# Patient Record
Sex: Female | Born: 1950 | Race: White | Hispanic: No | Marital: Married | Smoking: Former smoker
Health system: Southern US, Community
[De-identification: ages and names within clinical notes are randomized; demographics above are authoritative.]

## PROBLEM LIST (undated history)

## (undated) DIAGNOSIS — K644 Residual hemorrhoidal skin tags: Secondary | ICD-10-CM

## (undated) DIAGNOSIS — Z9889 Other specified postprocedural states: Secondary | ICD-10-CM

## (undated) DIAGNOSIS — M810 Age-related osteoporosis without current pathological fracture: Secondary | ICD-10-CM

## (undated) DIAGNOSIS — H269 Unspecified cataract: Secondary | ICD-10-CM

## (undated) DIAGNOSIS — K219 Gastro-esophageal reflux disease without esophagitis: Secondary | ICD-10-CM

## (undated) DIAGNOSIS — K648 Other hemorrhoids: Secondary | ICD-10-CM

## (undated) DIAGNOSIS — Z86018 Personal history of other benign neoplasm: Secondary | ICD-10-CM

## (undated) DIAGNOSIS — R112 Nausea with vomiting, unspecified: Secondary | ICD-10-CM

## (undated) HISTORY — DX: Age-related osteoporosis without current pathological fracture: M81.0

## (undated) HISTORY — PX: TUBAL LIGATION: SHX77

## (undated) HISTORY — DX: Unspecified cataract: H26.9

---

## 1997-03-20 ENCOUNTER — Other Ambulatory Visit: Admission: RE | Admit: 1997-03-20 | Discharge: 1997-03-20 | Payer: Self-pay | Admitting: Obstetrics and Gynecology

## 2000-06-07 ENCOUNTER — Other Ambulatory Visit: Admission: RE | Admit: 2000-06-07 | Discharge: 2000-06-07 | Payer: Self-pay | Admitting: Internal Medicine

## 2001-04-20 ENCOUNTER — Other Ambulatory Visit: Admission: RE | Admit: 2001-04-20 | Discharge: 2001-04-20 | Payer: Self-pay | Admitting: Obstetrics and Gynecology

## 2002-04-25 ENCOUNTER — Other Ambulatory Visit: Admission: RE | Admit: 2002-04-25 | Discharge: 2002-04-25 | Payer: Self-pay | Admitting: Obstetrics and Gynecology

## 2003-04-03 ENCOUNTER — Other Ambulatory Visit: Admission: RE | Admit: 2003-04-03 | Discharge: 2003-04-03 | Payer: Self-pay | Admitting: Obstetrics and Gynecology

## 2004-04-09 ENCOUNTER — Other Ambulatory Visit: Admission: RE | Admit: 2004-04-09 | Discharge: 2004-04-09 | Payer: Self-pay | Admitting: Obstetrics and Gynecology

## 2004-05-12 ENCOUNTER — Encounter (INDEPENDENT_AMBULATORY_CARE_PROVIDER_SITE_OTHER): Payer: Self-pay | Admitting: Specialist

## 2004-05-12 ENCOUNTER — Observation Stay (HOSPITAL_COMMUNITY): Admission: RE | Admit: 2004-05-12 | Discharge: 2004-05-13 | Payer: Self-pay | Admitting: Obstetrics and Gynecology

## 2004-05-12 HISTORY — PX: VAGINAL HYSTERECTOMY: SUR661

## 2004-08-16 LAB — HM DEXA SCAN

## 2005-04-13 ENCOUNTER — Other Ambulatory Visit: Admission: RE | Admit: 2005-04-13 | Discharge: 2005-04-13 | Payer: Self-pay | Admitting: Obstetrics and Gynecology

## 2005-04-13 LAB — HM PAP SMEAR: HM Pap smear: NORMAL

## 2009-10-09 ENCOUNTER — Encounter: Admission: RE | Admit: 2009-10-09 | Discharge: 2009-10-09 | Payer: Self-pay | Admitting: Obstetrics and Gynecology

## 2010-07-04 NOTE — Op Note (Signed)
Judy Donovan, Judy Donovan            ACCOUNT NO.:  [id number]   MEDICAL RECORD NO.:  [id number]          PATIENT TYPE:  AMB   LOCATION:  SDC                           FACILITY:  [facility]   PHYSICIAN:  Cynthia P. Romine, M.D.DATE OF BIRTH:  1951/02/14   DATE OF PROCEDURE:  05/12/2004  DATE OF DISCHARGE:                                 OPERATIVE REPORT   PREOPERATIVE DIAGNOSIS:  Postmenopausal bleeding, known submucous fibroids,  and intramural fibroids.  The patient with strong desire for removal of both  ovaries.   POSTOPERATIVE DIAGNOSIS:  Postmenopausal bleeding, known submucous fibroids,  and intramural fibroids.  The patient with strong desire for removal of both  ovaries. Pathology pending.   PROCEDURE:  Total vaginal hysterectomy, bilateral salpingo-oophorectomy.   SURGEON:  Cynthia P. Romine, M.D.   ASSISTANT:  Andres Ege, M.D.   ANESTHESIA:  General endotracheal.   ESTIMATED BLOOD LOSS:  375 mL.   COMPLICATIONS:  None.   DESCRIPTION OF PROCEDURE:  The patient was taken to the operating room and  after the induction of adequate general endotracheal anesthesia, was placed  in the dorsal lithotomy position and prepped and draped in the usual  fashion.  The bladder was drained with a red rubber catheter.  A posterior  weighted and anterior Deaver retractor were placed.  Cervix was grasped with  a Christella Hartigan tenaculum.  The mucosa over the cervix was infiltrated with  approximately 6 mL of 1% Xylocaine with epinephrine.  The mucosa was incised  off the cervix and pushed back sharply.  Attention was next turned  posteriorly.  A posterior colpotomy incision was made and the Banana  retractor was placed into the posterior cul-de-sac.  The uterosacral  ligaments were clamped, cut, and tied bilaterally.  Attention was next  turned anteriorly.  The anterior peritoneum was identified, elevated, and  entered atraumatically.  Deaver retractor was placed and bowel was noted to  confirm proper placement in the anterior cul-de-sac.  The pedicle containing  the uterine arteries was then clamped, cut, and tied on both sides.  The  hysterectomy continued up the broad ligament, clamping, cutting, and tying  in sequence.  There was an approximate 2 cm intramural myoma on the  patient's left which made this slightly more difficult, however, upon going  about halfway up the broad ligament, the surgeon was able to deliver the  fundus through the posterior cul-de-sac and the pedicle containing the tube,  the round ligament, and the utero-ovarian ligament were clamped, cut, and  tied on both sides, and the specimen sent to pathology.  These pedicles were  doubly tied first with a free tie of 0 Vicryl and then with a transfixation  suture of 0 Vicryl.  Attention was next turned to the BSO.  The ovary on the  left was grasped with the Tanja Port as was the tube and it was brought into  view.  A Heaney clamp was placed behind the ovary and the tube across the  infundibulopelvic ligament and the specimen was removed.  The pedicle was  doubly tied with two free ties and then also suture  transfixed with 0  Vicryl.  No bleeding was encountered.  The procedure was repeated on the  patient's right identifying the tubes and ovaries, grasping it with  Babcock's, and bringing them into the surgical field.  A Heaney was placed  across the infundibulopelvic ligament.  The specimen was removed and sent to  pathology and the pedicle was controlled using two free ties of 0 Vicryl and  then a 0 Vicryl transfixation stitch.  Again the pedicle was inspected and  felt to be hemostatic.  The abdomen was irrigated.  Irrigant was clear.  Pedicles were again inspected and were felt to be free of bleeding.  Attention was next turned to the vaginal cuff.  It was closed from 2 o'clock  to 10 o'clock in a running locking fashion.  A McCall stitch was then placed  bringing the uterosacral ligaments together  in the midline and tied.  A Weck-  cel sponge was then placed back up in the abdominal cavity and pulled back  out and it was noted to have minimal blood on the tail sponge.  The abdomen  and vagina were irrigated.  The cuff was closed with figure-of-eight sutures  of 0 Vicryl.  The vagina was irrigated.  The bladder was drained with a red  rubber catheter and the procedure was terminated.  Needle, sponge, and  instrument counts correct x3.  The patient was taken to the recovery room in  satisfactory condition.      CPR/MEDQ  D:  05/12/2004  T:  05/12/2004  Job:  [id number]

## 2010-09-02 ENCOUNTER — Other Ambulatory Visit: Payer: Self-pay | Admitting: Obstetrics and Gynecology

## 2010-09-02 ENCOUNTER — Other Ambulatory Visit: Payer: Self-pay | Admitting: Occupational Therapy

## 2010-09-02 DIAGNOSIS — M8580 Other specified disorders of bone density and structure, unspecified site: Secondary | ICD-10-CM

## 2010-09-02 DIAGNOSIS — Z1231 Encounter for screening mammogram for malignant neoplasm of breast: Secondary | ICD-10-CM

## 2010-11-17 ENCOUNTER — Other Ambulatory Visit: Payer: Self-pay

## 2010-11-17 ENCOUNTER — Ambulatory Visit: Payer: Self-pay

## 2010-11-18 ENCOUNTER — Ambulatory Visit
Admission: RE | Admit: 2010-11-18 | Discharge: 2010-11-18 | Disposition: A | Discharge status: Home / Self Care | Payer: BC Managed Care – PPO | Source: Ambulatory Visit | Attending: Obstetrics and Gynecology | Admitting: Obstetrics and Gynecology

## 2010-11-18 DIAGNOSIS — M8580 Other specified disorders of bone density and structure, unspecified site: Secondary | ICD-10-CM

## 2010-11-18 DIAGNOSIS — Z1231 Encounter for screening mammogram for malignant neoplasm of breast: Secondary | ICD-10-CM

## 2011-10-13 ENCOUNTER — Other Ambulatory Visit: Payer: Self-pay | Admitting: Obstetrics and Gynecology

## 2011-10-13 DIAGNOSIS — Z1231 Encounter for screening mammogram for malignant neoplasm of breast: Secondary | ICD-10-CM

## 2011-11-19 ENCOUNTER — Ambulatory Visit
Admission: RE | Admit: 2011-11-19 | Discharge: 2011-11-19 | Disposition: A | Discharge status: Home / Self Care | Payer: BC Managed Care – PPO | Source: Ambulatory Visit | Attending: Obstetrics and Gynecology | Admitting: Obstetrics and Gynecology

## 2011-11-19 DIAGNOSIS — Z1231 Encounter for screening mammogram for malignant neoplasm of breast: Secondary | ICD-10-CM

## 2012-07-14 ENCOUNTER — Encounter: Payer: Self-pay | Admitting: *Deleted

## 2012-07-15 ENCOUNTER — Ambulatory Visit (INDEPENDENT_AMBULATORY_CARE_PROVIDER_SITE_OTHER): Payer: BC Managed Care – PPO | Admitting: Nurse Practitioner

## 2012-07-15 ENCOUNTER — Encounter: Payer: Self-pay | Admitting: Nurse Practitioner

## 2012-07-15 VITALS — BP 124/60 | HR 66 | Resp 14 | Ht 65.0 in | Wt 169.4 lb

## 2012-07-15 DIAGNOSIS — Z01419 Encounter for gynecological examination (general) (routine) without abnormal findings: Secondary | ICD-10-CM

## 2012-07-15 DIAGNOSIS — Z0000 Encounter for general adult medical examination without abnormal findings: Secondary | ICD-10-CM

## 2012-07-15 LAB — POCT URINALYSIS DIPSTICK
Leukocytes, UA: NEGATIVE
Spec Grav, UA: 1.015
Urobilinogen, UA: NEGATIVE
pH, UA: 5.5

## 2012-07-15 NOTE — Progress Notes (Signed)
62 y.o. G2P2 Married Caucasian Fe here for annual exam.  No health concerns.  She does want to come off Vivelle dot.  She has been tapering dose since January. Now using patch every 2 wk's for this past month and doing OK. Current patch was applied two weeks ago. Using OTC lubrication for vaginal dryness.  No LMP recorded. Patient is postmenopausal.          Sexually active: yes  The current method of family planning is status post hysterectomy.    Exercising: no  The patient does not participate in regular exercise at present. Smoker:  no  Health Maintenance: Pap:  04/13/2005  Normal  MMG:  11/2011 normal Colonoscopy:  2007 polyp repeat in 5 years - insurance not covering so does IFOB BMD:   2012 T Score: spine -1.5, hip - 2.0, wants to repeat next year TDaP:  Up to date, per pt. 2005 Labs: Hgb- 14.1   reports that she has never smoked. She has never used smokeless tobacco. She reports that she drinks about 0.5 ounces of alcohol per week. She reports that she does not use illicit drugs.  Past Medical History  Diagnosis Date  . Menopausal and perimenopausal disorder   . Osteoporosis     Past Surgical History  Procedure Laterality Date  . Abdominal hysterectomy  04/2004    TVH/BSO  . Tubal ligation      BTL  . Vaginal delivery      SVD x3    Current Outpatient Prescriptions  Medication Sig Dispense Refill  . Ascorbic Acid (VITAMIN C) 100 MG tablet Take 100 mg by mouth daily.      Marland Kitchen aspirin 81 MG tablet Take 81 mg by mouth daily.      . Calcium-Vitamin D-Vitamin K (VIACTIV PO) Take by mouth daily.      . cetirizine (ZYRTEC) 5 MG tablet Take 5 mg by mouth daily.      Marland Kitchen estradiol (VIVELLE-DOT) 0.05 MG/24HR Place 1 patch onto the skin once a week.      . fish oil-omega-3 fatty acids 1000 MG capsule Take 2 g by mouth daily.      . Multiple Vitamin (MULTIVITAMIN) tablet Take 1 tablet by mouth daily.      . psyllium (REGULOID) 0.52 G capsule Take 0.52 g by mouth daily.      .  vitamin E 100 UNIT capsule Take 100 Units by mouth daily.       No current facility-administered medications for this visit.    Family History  Problem Relation Age of Onset  . Osteoarthritis Mother   . Hypertension Mother     ROS:  Pertinent items are noted in HPI.  Otherwise, a comprehensive ROS was negative.  Exam:   BP 124/60  Pulse 66  Resp 14  Ht 5\' 5"  (1.651 m)  Wt 169 lb 6.4 oz (76.839 kg)  BMI 28.19 kg/m2 Height: 5\' 5"  (165.1 cm)  Ht Readings from Last 3 Encounters:  07/15/12 5\' 5"  (1.651 m)    General appearance: alert, cooperative and appears stated age Head: Normocephalic, without obvious abnormality, atraumatic Neck: no adenopathy, supple, symmetrical, trachea midline and thyroid normal to inspection and palpation Lungs: clear to auscultation bilaterally Breasts: normal appearance, no masses or tenderness Heart: regular rate and rhythm Abdomen: soft, non-tender; no masses,  no organomegaly Extremities: extremities normal, atraumatic, no cyanosis or edema Skin: Skin color, texture, turgor normal. No rashes or lesions Lymph nodes: Cervical, supraclavicular, and axillary nodes normal.  No abnormal inguinal nodes palpated Neurologic: Grossly normal   Pelvic: External genitalia:  no lesions              Urethra:  normal appearing urethra with no masses, tenderness or lesions              Bartholin's and Skene's: normal                 Vagina: normal appearing vagina with normal color and discharge, no lesions              Cervix: absent              Pap taken: no Bimanual Exam:  Uterus:  uterus absent              Adnexa: no mass, fullness, tenderness               Rectovaginal: Confirms               Anus:  normal sphincter tone, no lesions  A:  Well Woman with normal exam  S/P TVH/BSO 04/2004 on ERT since then - now tapering off  P:   Pap smear as per guidelines   Mammogram due 10/14  She decides to see PCP to get regular labs this year and update on  Shingles vaccine  She has decided to discontinue ERT - if symptoms gets unbearable to call back  counseled on adequate intake of calcium and vitamin D, exercise, osteoporosis, SBE  return annually or prn  An After Visit Summary was printed and given to the patient.

## 2012-07-15 NOTE — Patient Instructions (Signed)

## 2012-07-15 NOTE — Progress Notes (Signed)
Reviewed personally. 

## 2012-07-18 LAB — HEMOGLOBIN, FINGERSTICK: Hemoglobin, fingerstick: 14.1 g/dL (ref 12.0–16.0)

## 2012-07-19 ENCOUNTER — Ambulatory Visit: Payer: BC Managed Care – PPO | Admitting: Nurse Practitioner

## 2012-11-01 ENCOUNTER — Other Ambulatory Visit: Payer: Self-pay

## 2012-11-01 DIAGNOSIS — Z1231 Encounter for screening mammogram for malignant neoplasm of breast: Secondary | ICD-10-CM

## 2012-11-21 ENCOUNTER — Ambulatory Visit
Admission: RE | Admit: 2012-11-21 | Discharge: 2012-11-21 | Disposition: A | Discharge status: Home / Self Care | Payer: BC Managed Care – PPO | Source: Ambulatory Visit

## 2012-11-21 DIAGNOSIS — Z1231 Encounter for screening mammogram for malignant neoplasm of breast: Secondary | ICD-10-CM

## 2013-02-16 HISTORY — PX: CATARACT EXTRACTION W/ INTRAOCULAR LENS  IMPLANT, BILATERAL: SHX1307

## 2013-07-20 ENCOUNTER — Ambulatory Visit (INDEPENDENT_AMBULATORY_CARE_PROVIDER_SITE_OTHER): Payer: BC Managed Care – PPO | Admitting: Nurse Practitioner

## 2013-07-20 ENCOUNTER — Encounter: Payer: Self-pay | Admitting: Nurse Practitioner

## 2013-07-20 VITALS — BP 108/66 | HR 76 | Ht 65.25 in | Wt 170.0 lb

## 2013-07-20 DIAGNOSIS — Z23 Encounter for immunization: Secondary | ICD-10-CM

## 2013-07-20 DIAGNOSIS — M899 Disorder of bone, unspecified: Secondary | ICD-10-CM

## 2013-07-20 DIAGNOSIS — Z01419 Encounter for gynecological examination (general) (routine) without abnormal findings: Secondary | ICD-10-CM

## 2013-07-20 DIAGNOSIS — M949 Disorder of cartilage, unspecified: Secondary | ICD-10-CM

## 2013-07-20 DIAGNOSIS — M8580 Other specified disorders of bone density and structure, unspecified site: Secondary | ICD-10-CM

## 2013-07-20 DIAGNOSIS — Z1211 Encounter for screening for malignant neoplasm of colon: Secondary | ICD-10-CM

## 2013-07-20 DIAGNOSIS — Z0000 Encounter for general adult medical examination without abnormal findings: Secondary | ICD-10-CM

## 2013-07-20 LAB — POCT URINALYSIS DIPSTICK
Bilirubin, UA: NEGATIVE
Blood, UA: NEGATIVE
Glucose, UA: NEGATIVE
Ketones, UA: NEGATIVE
Leukocytes, UA: NEGATIVE
Nitrite, UA: NEGATIVE
Protein, UA: NEGATIVE
Urobilinogen, UA: NEGATIVE
pH, UA: 5

## 2013-07-20 LAB — COMPREHENSIVE METABOLIC PANEL
ALT: 27 U/L (ref 0–35)
AST: 28 U/L (ref 0–37)
Albumin: 4 g/dL (ref 3.5–5.2)
Alkaline Phosphatase: 73 U/L (ref 39–117)
BUN: 13 mg/dL (ref 6–23)
CO2: 28 mEq/L (ref 19–32)
Calcium: 9.8 mg/dL (ref 8.4–10.5)
Chloride: 104 mEq/L (ref 96–112)
Creat: 0.73 mg/dL (ref 0.50–1.10)
Glucose, Bld: 79 mg/dL (ref 70–99)
Potassium: 4.2 mEq/L (ref 3.5–5.3)
Sodium: 143 mEq/L (ref 135–145)
Total Bilirubin: 0.4 mg/dL (ref 0.2–1.2)
Total Protein: 7.1 g/dL (ref 6.0–8.3)

## 2013-07-20 LAB — LIPID PANEL
Cholesterol: 256 mg/dL — ABNORMAL HIGH (ref 0–200)
HDL: 53 mg/dL (ref >39)
LDL Cholesterol: 168 mg/dL — ABNORMAL HIGH (ref 0–99)
Total CHOL/HDL Ratio: 4.8 Ratio
Triglycerides: 177 mg/dL — ABNORMAL HIGH (ref <150)
VLDL: 35 mg/dL (ref 0–40)

## 2013-07-20 LAB — HEMOGLOBIN, FINGERSTICK: Hemoglobin, fingerstick: 13.8 g/dL (ref 12.0–16.0)

## 2013-07-20 NOTE — Progress Notes (Signed)
Patient ID: Judy Donovan, female   DOB: 07/31/1950, 63 y.o.   MRN: [id number] 63 y.o. G2P2 Married Caucasian Fe here for annual exam.  Now off Vivelle dot since June 2014. She is doing well.  Sometimes feels like bladder is falling.  Occasionally has a fullness with SA that she thinks is related to bladder falling.  Patient's last menstrual period was 04/16/2004.          Sexually active: yes  The current method of family planning is post menopausal status and hysterectomy.    Exercising: yes  walking Smoker:  no  Health Maintenance: Pap: 04/13/2005 Normal  MMG: 11/21/12, Bi-Rads 1: negative   Colonoscopy: 2007 polyp repeat in 5 years - insurance not covering so does IFOB  BMD: 2012 T Score: spine -1.5, hip - 2.0, wants to repeat next year  TDaP: 2005, would like to have today Labs:  HB: 13.8   Urine:  Negative     reports that she has never smoked. She has never used smokeless tobacco. She reports that she drinks about .5 ounces of alcohol per week. She reports that she does not use illicit drugs.  Past Medical History  Diagnosis Date  . Menopausal and perimenopausal disorder   . Osteoporosis     Past Surgical History  Procedure Laterality Date  . Abdominal hysterectomy  04/2004    TVH/BSO  . Tubal ligation      BTL  . Vaginal delivery      SVD x3  . Cataract extraction extracapsular Bilateral 4/15, 5/15    Current Outpatient Prescriptions  Medication Sig Dispense Refill  . aspirin 81 MG tablet Take 81 mg by mouth daily.      . cetirizine (ZYRTEC) 5 MG tablet Take 5 mg by mouth daily.      . fish oil-omega-3 fatty acids 1000 MG capsule Take 2 g by mouth daily.      . Multiple Vitamin (MULTIVITAMIN) tablet Take 1 tablet by mouth daily.      . psyllium (REGULOID) 0.52 G capsule Take 0.52 g by mouth daily.      . vitamin E 100 UNIT capsule Take 100 Units by mouth daily.      . Ascorbic Acid (VITAMIN C) 100 MG tablet Take 100 mg by mouth daily.      . Calcium-Vitamin  D-Vitamin K (VIACTIV PO) Take 2 tablets by mouth daily.        No current facility-administered medications for this visit.    Family History  Problem Relation Age of Onset  . Osteoarthritis Mother   . Hypertension Mother     ROS:  Pertinent items are noted in HPI.  Otherwise, a comprehensive ROS was negative.  Exam:   BP 108/66  Pulse 76  Ht 5' 5.25" (1.657 m)  Wt 170 lb (77.111 kg)  BMI 28.08 kg/m2  LMP 04/16/2004 Height: 5' 5.25" (165.7 cm)  Ht Readings from Last 3 Encounters:  07/20/13 5' 5.25" (1.657 m)  07/15/12 5\' 5"  (1.651 m)    General appearance: alert, cooperative and appears stated age Head: Normocephalic, without obvious abnormality, atraumatic Neck: no adenopathy, supple, symmetrical, trachea midline and thyroid normal to inspection and palpation Lungs: clear to auscultation bilaterally Breasts: normal appearance, no masses or tenderness Heart: regular rate and rhythm Abdomen: soft, non-tender; no masses,  no organomegaly Extremities: extremities normal, atraumatic, no cyanosis or edema Skin: Skin color, texture, turgor normal. No rashes or lesions Lymph nodes: Cervical, supraclavicular, and axillary nodes normal. No abnormal  inguinal nodes palpated Neurologic: Grossly normal   Pelvic: External genitalia:  no lesions              Urethra:  normal appearing urethra with no masses, tenderness or lesions              Bartholin's and Skene's: normal                 Vagina: some atrophic changes appearing vagina with pale color and discharge, no lesions.   There is a grade I cystocele              Cervix: absent              Pap taken: no Bimanual Exam:  Uterus:  uterus absent              Adnexa: no mass, fullness, tenderness               Rectovaginal: Confirms               Anus:  normal sphincter tone, no lesions  A:  Well Woman with normal exam  S/P TVH, BSO secondary to fibroids 04/2004  ERT from 04/2004 - 07/2012  Mild grade I cystocele  Osteopenia    Immunization update  P:   Reviewed health and wellness pertinent to exam  Pap smear not taken today  Follow with routine labs  TDaP given today  IFOB is given  Mammogram is due 10/15 and will get repeat BMD at that time  Counseled on breast self exam, mammography screening, adequate intake of calcium and vitamin D, diet and exercise, Kegel's exercises return annually or prn  An After Visit Summary was printed and given to the patient.

## 2013-07-20 NOTE — Patient Instructions (Signed)

## 2013-07-21 LAB — VITAMIN D 25 HYDROXY (VIT D DEFICIENCY, FRACTURES): Vit D, 25-Hydroxy: 79 ng/mL (ref 30–89)

## 2013-07-21 LAB — TSH: TSH: 1.936 u[IU]/mL (ref 0.350–4.500)

## 2013-07-21 NOTE — Progress Notes (Signed)
Encounter reviewed by Dr. Sricharan Lacomb Silva.  

## 2013-08-02 NOTE — Addendum Note (Signed)
Addended by: Graylon Good on: 08/02/2013 08:52 AM   Modules accepted: Orders

## 2013-08-03 ENCOUNTER — Other Ambulatory Visit: Payer: Self-pay

## 2013-08-03 DIAGNOSIS — Z1231 Encounter for screening mammogram for malignant neoplasm of breast: Secondary | ICD-10-CM

## 2013-08-08 NOTE — Addendum Note (Signed)
Addended by: Antonietta Barcelona on: 08/08/2013 08:27 AM   Modules accepted: Orders

## 2013-08-23 LAB — FECAL OCCULT BLOOD, IMMUNOCHEMICAL: IFOBT: NEGATIVE

## 2013-08-24 ENCOUNTER — Telehealth: Payer: Self-pay | Admitting: *Deleted

## 2013-08-24 NOTE — Telephone Encounter (Signed)
I have attempted to contact this patient by phone with the following results: left message to return my call on answering machine (home per [facility]).  

## 2013-08-24 NOTE — Telephone Encounter (Signed)
Message copied by Graylon Good on Thu Aug 24, 2013  9:16 AM ------      Message from: Regina Eck      Created: Wed Aug 23, 2013  2:58 PM       Notify fecal occult blood stool screen was negattive ------

## 2013-08-25 NOTE — Telephone Encounter (Signed)
Pt notified in result note 08/24/13.

## 2013-11-22 ENCOUNTER — Ambulatory Visit
Admission: RE | Admit: 2013-11-22 | Discharge: 2013-11-22 | Disposition: A | Discharge status: Home / Self Care | Payer: BC Managed Care – PPO | Source: Ambulatory Visit

## 2013-11-22 ENCOUNTER — Ambulatory Visit
Admission: RE | Admit: 2013-11-22 | Discharge: 2013-11-22 | Disposition: A | Discharge status: Home / Self Care | Payer: BC Managed Care – PPO | Source: Ambulatory Visit | Attending: Nurse Practitioner | Admitting: Nurse Practitioner

## 2013-11-22 DIAGNOSIS — Z1231 Encounter for screening mammogram for malignant neoplasm of breast: Secondary | ICD-10-CM

## 2013-11-22 DIAGNOSIS — M8580 Other specified disorders of bone density and structure, unspecified site: Secondary | ICD-10-CM

## 2013-11-28 ENCOUNTER — Ambulatory Visit (INDEPENDENT_AMBULATORY_CARE_PROVIDER_SITE_OTHER): Payer: BC Managed Care – PPO | Admitting: Podiatry

## 2013-11-28 ENCOUNTER — Encounter: Payer: Self-pay | Admitting: Podiatry

## 2013-11-28 ENCOUNTER — Ambulatory Visit (INDEPENDENT_AMBULATORY_CARE_PROVIDER_SITE_OTHER): Payer: BC Managed Care – PPO

## 2013-11-28 VITALS — Ht 65.5 in | Wt 165.0 lb

## 2013-11-28 DIAGNOSIS — M779 Enthesopathy, unspecified: Secondary | ICD-10-CM

## 2013-11-28 DIAGNOSIS — S92301A Fracture of unspecified metatarsal bone(s), right foot, initial encounter for closed fracture: Secondary | ICD-10-CM

## 2013-11-28 NOTE — Progress Notes (Signed)
   Subjective:    Patient ID: Arlys Scatena, female    DOB: May 08, 1950, 63 y.o.   MRN: [id number]  HPI Comments: This foot has been swelling for 6 weeks and the foot has been hurting. Right foot lateral side of foot. It started on the top of the foot and moved to along the side and ankle and it has been swelling since   Foot Pain      Review of Systems  All other systems reviewed and are negative.      Objective:   Physical Exam: I have reviewed her past medical history medications allergies surgeries social history family history. Pulses are strongly palpable bilateral. Neurologic sensorium is intact per Semmes-Weinstein monofilament. Deep tendon reflexes are intact bilateral muscle strength is 5 over 5 dorsiflexors plantar flexors inverters everters all intrinsic musculature is intact. Orthopedic evaluation demonstrates all joints distal to the ankle a full range of motion without crepitation. She has pain on palpation with mild edema overlying the fourth fifth met base of the right foot. Radiographic evaluation does demonstrate a stress fracture fifth metatarsal base area of the right foot. This fracture is nondisplaced non-comminuted. Assessment: Nondisplaced non-comminuted stress fracture fifth metatarsal base right foot.  Plan: Compression anklet was dispensed as well as a Cam Walker. I will followup with her in 4 weeks for another set of x-rays.        Assessment & Plan:

## 2013-11-30 ENCOUNTER — Telehealth: Payer: Self-pay | Admitting: *Deleted

## 2013-11-30 NOTE — Telephone Encounter (Signed)
I have attempted to contact this patient by phone with the following results: left message to return call to [place] at [phone] answering machine (home per [facility]).  No personal information given.  [phone] (Home)

## 2013-12-04 NOTE — Telephone Encounter (Signed)
Pt is calling stephanie back °

## 2013-12-04 NOTE — Telephone Encounter (Signed)
Pt notified in result note.  Closing encounter. 

## 2013-12-05 ENCOUNTER — Telehealth: Payer: Self-pay | Admitting: Nurse Practitioner

## 2013-12-05 NOTE — Telephone Encounter (Signed)
Pt is calling stating that patty told her yesterday that she needed to schedule a consult with silva or miller to discuss her bone density. i dont see any notes about this.

## 2013-12-05 NOTE — Telephone Encounter (Signed)
Spoke with patient. Patient states "When Colletta [place] gave me my bone density results yesterday she told me Patty recommends that I have a consult with Dr.Silva or Dr.Miller. I just wanted to go ahead and get that scheduled so I can take care of myself." Consult appointment scheduled with Dr.Miller for 10/24 at 3pm. Patient is agreeable to date and time.  Routing to Dr.Miller Cc: Milford Cage, FNP   Routing to provider for final review. Patient agreeable to disposition. Will close encounter

## 2013-12-18 ENCOUNTER — Encounter: Payer: Self-pay | Admitting: Podiatry

## 2013-12-26 ENCOUNTER — Encounter: Payer: Self-pay | Admitting: Podiatry

## 2013-12-26 ENCOUNTER — Ambulatory Visit (INDEPENDENT_AMBULATORY_CARE_PROVIDER_SITE_OTHER): Payer: BC Managed Care – PPO | Admitting: Podiatry

## 2013-12-26 ENCOUNTER — Ambulatory Visit (INDEPENDENT_AMBULATORY_CARE_PROVIDER_SITE_OTHER): Payer: BC Managed Care – PPO

## 2013-12-26 VITALS — BP 124/81 | HR 87 | Resp 16

## 2013-12-26 DIAGNOSIS — S92301D Fracture of unspecified metatarsal bone(s), right foot, subsequent encounter for fracture with routine healing: Secondary | ICD-10-CM

## 2013-12-26 NOTE — Progress Notes (Signed)
She presents today for follow-up of her fractured fifth metatarsal base of the right foot. She presents in her Cam Walker. She states that it feels almost 100% improved.  Objective: Vital signs are stable she is alert and oriented 3. Pulses are palpable right foot. No pain on palpation fifth metatarsal base or peroneal tendons of the right foot.radiographic [place] well-healed fifth metatarsal fracture styloid process right.  Assessment:well healing fifth metatarsal base right foot.  Plan: Discontinue use of the Cam Walker follow-up with me as needed.

## 2014-01-09 ENCOUNTER — Other Ambulatory Visit: Payer: Self-pay | Admitting: Nurse Practitioner

## 2014-01-09 ENCOUNTER — Institutional Professional Consult (permissible substitution): Payer: BC Managed Care – PPO | Admitting: Obstetrics & Gynecology

## 2014-01-09 ENCOUNTER — Telehealth: Payer: Self-pay | Admitting: Nurse Practitioner

## 2014-01-09 MED ORDER — PRAMOXINE HCL 1 % RE FOAM: 1.0000 "application " | Freq: Three times a day (TID) | RECTAL | Status: DC | PRN

## 2014-01-09 NOTE — Telephone Encounter (Signed)
Spoke with patient. Advised patient of message as seen below from Patricia Rolen-Grubb, FNP. Patient is agreeable and verbalizes understanding.  Routing to provider for final review. Patient agreeable to disposition. Will close encounter ; 

## 2014-01-09 NOTE — Telephone Encounter (Signed)
Spoke with patient. Patient states that for 2-3 days "My hemorrhoid that is on the outside has been bothering me. It hurts to sit and walk. It is like the more I stand the worse it is. I have used Preparation H, my suppositories, a warm cloth, and I have taken a sitz bath with barely any relief. I have to be on me feet all day tomorrow and Thursday cooking and I was hoping she may have some recommendations that may help me." Advised patient will send a message to Milford Cage, [place] and return call with further recommendations and instructions for patient. Patient is agreeable.

## 2014-01-09 NOTE — Telephone Encounter (Signed)
Patient is having "a terrible time with hemorrhoids" and is wondering if Patty could recommend something besides Preparation   H.

## 2014-01-09 NOTE — Telephone Encounter (Signed)
Give her Proctofoam HC to use BID prn.  I will place the order.

## 2014-02-12 ENCOUNTER — Ambulatory Visit (INDEPENDENT_AMBULATORY_CARE_PROVIDER_SITE_OTHER): Payer: BC Managed Care – PPO | Admitting: Obstetrics & Gynecology

## 2014-02-12 ENCOUNTER — Encounter: Payer: Self-pay | Admitting: Obstetrics & Gynecology

## 2014-02-12 VITALS — BP 122/78 | HR 64 | Resp 16 | Wt 169.8 lb

## 2014-02-12 DIAGNOSIS — M810 Age-related osteoporosis without current pathological fracture: Secondary | ICD-10-CM

## 2014-02-12 LAB — COMPREHENSIVE METABOLIC PANEL
ALT: 16 U/L (ref 0–35)
AST: 19 U/L (ref 0–37)
Albumin: 4 g/dL (ref 3.5–5.2)
Alkaline Phosphatase: 90 U/L (ref 39–117)
BUN: 15 mg/dL (ref 6–23)
CO2: 27 mEq/L (ref 19–32)
Calcium: 9.1 mg/dL (ref 8.4–10.5)
Chloride: 106 mEq/L (ref 96–112)
Creat: 0.72 mg/dL (ref 0.50–1.10)
Glucose, Bld: 100 mg/dL — ABNORMAL HIGH (ref 70–99)
Potassium: 4.1 mEq/L (ref 3.5–5.3)
Sodium: 142 mEq/L (ref 135–145)
Total Bilirubin: 0.2 mg/dL (ref 0.2–1.2)
Total Protein: 6.8 g/dL (ref 6.0–8.3)

## 2014-02-12 NOTE — Progress Notes (Signed)
Second note opened in error.

## 2014-02-13 LAB — TSH: TSH: 1.653 u[IU]/mL (ref 0.350–4.500)

## 2014-02-13 LAB — PTH, INTACT AND CALCIUM
Calcium: 9.3 mg/dL (ref 8.4–10.5)
PTH: 39 pg/mL (ref 14–64)

## 2014-02-13 LAB — VITAMIN D 25 HYDROXY (VIT D DEFICIENCY, FRACTURES): Vit D, 25-Hydroxy: 34 ng/mL (ref 30–100)

## 2014-02-14 ENCOUNTER — Telehealth: Payer: Self-pay | Admitting: Emergency Medicine

## 2014-02-14 DIAGNOSIS — M810 Age-related osteoporosis without current pathological fracture: Secondary | ICD-10-CM

## 2014-02-14 NOTE — Progress Notes (Signed)
Patient ID: Judy Donovan, female   DOB: [birth year], 63 y.o.   MRN: [id number]  Subjective:    19 yrs Married Caucasian G2P2  female here to discuss recent BMD obtained 11/22/13 showing osteoporosis in left femoral neck with t score -2.7.  Pt has mother with severe osteoporosis in spine and with dowager's hump.  Pt does NOT want to ever get that way.  Not on HRT.  Actively working on exercise and thinks she gets enough calcium but will need to double check.     Osteoporosis Risk Factors  Nonmodifiable Personal Hx of fracture as an adult: no Hx of fracture in first-degree relative: yes - mother, vertebral fractures Caucasian race: yes Advanced age: no Female sex: yes Dementia: no Poor health/frailty: no  Potentially modifiable: Tobacco use: no Low body weight (<127 lbs): no Estrogen deficiency  early menopause (age <45) or bilateral ovariectomy: no  prolonged premenopausal amenorrhea (>1 yr): no Low calcium intake (lifelong): no Alcohol use more than 2 drinks per day: yes, pt is using this for coping with mother's deterioration.  D/w pt need to cut back. Recurrent falls: no Inadequate physical activity: no  Current calcium and Vit D intake:  She thinks around 800mg  calcium and 1000 IU Vit D  Review of Systems A comprehensive review of systems was negative.     Objective:   PHYSICAL EXAM BP 122/78 mmHg  Pulse 64  Resp 16  Wt 169 lb 12.8 oz (77.021 kg)  LMP 04/16/2004 General appearance: alert and cooperative  Imaging Bone Density: Spine T Score: -2.4 (was -1.5 on 10/12) , Hip T Score: -2.7 (was -2.0 on 10/12)    Done on 11/22/13 FRAX score:  Not done due to osteoporosis                                       Assessment:   Osteoporosis with T score -2.7   Plan:   1.  Patient counseled in adequate calcium and vitamin D exposure.  Calcium - 500 - 1000 mg elemental calcium/day in divided doses  Vitamin D - 800 IU/day  Told not to take at same time as  bisphosphonate. 2.  Exercise recommended at least 30 minutes 3 times per week.  3.  Recommendation to avoid heavy ETOH use.  4.  Counseled to avoid tobacco and second had smoke. 5.  Fall prevention discussed. 6.  Pharmacologic therapy therapy below discussed including risks and benefits:   Bisphosphonates po (Fosamax, Actonel, Boniva)  Bisphosphonate IV (Reclast)  Evista  Prolia subcutaneous   Forteo subcutaneous  Calcitonin nasal spray  Estrogen/progesterone therapy Will try to precert Prolia as this is pt's preference for treatment 7.  Repeat bone density in 2 years. 8.  PTH, CMP, Vit D today   ~25 minutes spent with patient >50% of time was in face to face discussion of above.

## 2014-02-14 NOTE — Telephone Encounter (Signed)
Call to [facility] to request order from speciality pharmacy for Prolia 60 mg Injection.   Per representative, this medication will require prior authorization.   J code [id number] Dx M81.0  Prior authorization faxed with fax confirmation received to [facility] at this time.   Prior authorization reference number is #[id number]

## 2014-02-21 ENCOUNTER — Telehealth: Payer: Self-pay | Admitting: Obstetrics & Gynecology

## 2014-02-21 NOTE — Telephone Encounter (Signed)
Incoming call from patient. Advised patient that prior authorization for Prolia was sent on 02/14/14. Apologized,  But unable to complete prior authorization and receiving drug from speciality pharmacy in short time frame.   Since patient was unable to receive injection by 02/15/14 she would like to wait on process until Medicare is active 03/2014.   Patient is advised to call back with new insurance information in 03/2014 to initiate process to order Prolia. She will do so when she has new coverage information and will begin process.  Routing to provider for final review. Patient agreeable to disposition. Will close encounter

## 2014-04-10 ENCOUNTER — Telehealth: Payer: Self-pay | Admitting: Nurse Practitioner

## 2014-04-10 DIAGNOSIS — Z01812 Encounter for preprocedural laboratory examination: Secondary | ICD-10-CM

## 2014-04-10 NOTE — Telephone Encounter (Signed)
Patient wants to talk with the nurse about prolia.

## 2014-04-10 NOTE — Telephone Encounter (Signed)
Call to patient. Obtained new insurance information. Patient now covered by Medicare, [employer] [facility] and [facility] plan.   Request sent to [employer]ia at this time for insurance verification. Advised patient would contact her with information regarding insurance coverage.

## 2014-04-13 NOTE — Telephone Encounter (Signed)
Received forms from [employer]. Patient requires prior authorization for Prolia.  Called [employer] and was directed to [place] Review Department at [phone].   Spoke with Clinical review department and prior authorization is not required. Reference number is [id number]. They will fax confirmation of this as well.   [place] at [phone], spoke with Shanon Brow, who discussed with  Medicare pharmacy benefits, Rob. Rop states that patient has coverage for Prolia under her medicare part D coverage, reference number [id number].   Called [facility] to request re-verification of benefits for Medicare Part D Plan, [employer].

## 2014-04-18 NOTE — Telephone Encounter (Signed)
Received form from [facility] that states that prior authorization is not required.   Receiving incoming fax from [place], only has physician buy and bill cost. Called [employer] again and requested re-verfication of benefits for Medicare Part D Drug coverage Plan under [employer]. They will work on coverage check.

## 2014-04-20 NOTE — Telephone Encounter (Signed)
Called Prolia again, spoke with Judy Donovan. Advised I have requested x 3 for medicare part D plan coverage to be checked. He sees this as still pending. He states that he will notify management and will ensure that coverage for part D be checked at this time.

## 2014-04-23 NOTE — Telephone Encounter (Signed)
Patient calling. She states she received her benefit information from [facility] and is ready to proceed.   She is scheduled for lab draw, Calcium only for 04/24/14 at 0930.  She states she contacted [facility] on [address] and they have Prolia available and she would like to pick it up from there. Patient aware of cost to her, $616.00. Advised I could not give benefits information regarding cost for injection, that I am waiting to hear back from [employer] regarding coverage.  Patient would like to proceed at this time.   Scheduled lab appointment. Advised with normal CA result, would place order to [facility] and patient will have to pick up and have nurse appointment same day if she is bring medication.

## 2014-04-24 ENCOUNTER — Other Ambulatory Visit: Payer: Self-pay

## 2014-04-24 ENCOUNTER — Telehealth: Payer: Self-pay | Admitting: Nurse Practitioner

## 2014-04-24 NOTE — Telephone Encounter (Signed)
Pt was scheduled for calcium check for prolia.  Please advise follow up.

## 2014-04-24 NOTE — Telephone Encounter (Signed)
Pt called this morning to cancel her lab appointment. Pt states her mother had a stroke and they are at hospital now.  Pt will reschedule at later date.

## 2014-04-24 NOTE — Telephone Encounter (Signed)
Patient called and cancelled lab appointment for this morning due to Mother hospitalization.

## 2014-04-26 NOTE — Telephone Encounter (Signed)
Please look at this note about calcium - doesn't look like she is getting Prolia now.  So seems OK if she has to postpone due to mothers illness.  Agree?

## 2014-04-27 NOTE — Telephone Encounter (Signed)
OK to postpone for now and cancel lab testing.  Pt and I had consultation and she is aware of bone issues.  With family issues, I'm sure it's just not high on her priority list.

## 2014-04-30 ENCOUNTER — Other Ambulatory Visit (INDEPENDENT_AMBULATORY_CARE_PROVIDER_SITE_OTHER): Payer: Medicare Other

## 2014-04-30 DIAGNOSIS — Z01812 Encounter for preprocedural laboratory examination: Secondary | ICD-10-CM | POA: Diagnosis not present

## 2014-05-01 LAB — CALCIUM: Calcium: 9 mg/dL (ref 8.4–10.5)

## 2014-05-02 NOTE — Telephone Encounter (Signed)
Patient is waiting for lab results for prolia injection.

## 2014-05-02 NOTE — Telephone Encounter (Addendum)
Dr. Sabra Heck,  Calcium results are in:   CALCIUM  9.0  04/30/2014    Okay to order prolia 60 mg SQ x 1?

## 2014-05-03 MED ORDER — DENOSUMAB 60 MG/ML ~~LOC~~ SOLN: 60.0000 mg | Freq: Once | SUBCUTANEOUS | Status: DC

## 2014-05-03 NOTE — Telephone Encounter (Signed)
Yes.  Thanks.  Ok to close encounter.

## 2014-05-03 NOTE — Telephone Encounter (Signed)
Pt returned call. States [employer] will not have shot until 1or 2pm tomorrow 05/04/14. Pt states she is ok to pick up shot and come in tomorrow afternoon or wait until first thing Monday morning.  Pt requests call her mobile number: [phone]

## 2014-05-03 NOTE — Telephone Encounter (Signed)
Spoke with patient. She plans to pick up Prolia from [facility] on Monday morning and will come straight to office after pick up. If any issues with pick up she will call back.

## 2014-05-03 NOTE — Telephone Encounter (Signed)
Call to patient. She is advised CA is normal and okay to proceed with Prolia.   Rx sent to [facility] per her request.   She will call when ready to pick up rx and bring straight to office for nurse appointment.

## 2014-05-04 NOTE — Telephone Encounter (Signed)
Pt notifying Judy Donovan that the her shot is at the pharmacy. She will pick it up Monday morning at 9 am for her 10 am appointment.

## 2014-05-04 NOTE — Telephone Encounter (Signed)
Noted.  Will close encounter.  

## 2014-05-07 ENCOUNTER — Ambulatory Visit (INDEPENDENT_AMBULATORY_CARE_PROVIDER_SITE_OTHER): Payer: Medicare Other | Admitting: *Deleted

## 2014-05-07 VITALS — BP 132/80 | HR 96 | Resp 18 | Ht 65.5 in | Wt 166.0 lb

## 2014-05-07 DIAGNOSIS — M810 Age-related osteoporosis without current pathological fracture: Secondary | ICD-10-CM

## 2014-05-07 MED ORDER — DENOSUMAB 60 MG/ML ~~LOC~~ SOLN
60.0000 mg | Freq: Once | SUBCUTANEOUS | Status: AC
  Administered 2014-05-07: 60 mg via SUBCUTANEOUS

## 2014-05-07 NOTE — Progress Notes (Signed)
Patient in today for first Prolia Injection. Patient tolerated shot well. Waited 15 minutes at the office. No complications.  Advised pt to call back in ~ 5 months to schedule lab visit and to order Rx. - Patient verbalized understanding.

## 2014-07-24 ENCOUNTER — Encounter: Payer: Self-pay | Admitting: Nurse Practitioner

## 2014-07-24 ENCOUNTER — Ambulatory Visit (INDEPENDENT_AMBULATORY_CARE_PROVIDER_SITE_OTHER): Payer: Medicare Other | Admitting: Nurse Practitioner

## 2014-07-24 VITALS — BP 118/70 | HR 92 | Resp 20 | Ht 65.25 in | Wt 162.0 lb

## 2014-07-24 DIAGNOSIS — Z0000 Encounter for general adult medical examination without abnormal findings: Secondary | ICD-10-CM

## 2014-07-24 DIAGNOSIS — Z01419 Encounter for gynecological examination (general) (routine) without abnormal findings: Secondary | ICD-10-CM | POA: Diagnosis not present

## 2014-07-24 LAB — POCT URINALYSIS DIPSTICK
Bilirubin, UA: NEGATIVE
Blood, UA: NEGATIVE
Glucose, UA: NEGATIVE
Ketones, UA: NEGATIVE
Leukocytes, UA: NEGATIVE
Nitrite, UA: NEGATIVE
Protein, UA: NEGATIVE
Urobilinogen, UA: NEGATIVE
pH, UA: 7

## 2014-07-24 MED ORDER — PRAMOXINE HCL 1 % RE FOAM: 1.0000 "application " | Freq: Three times a day (TID) | RECTAL | Status: DC | PRN

## 2014-07-24 NOTE — Patient Instructions (Signed)

## 2014-07-24 NOTE — Progress Notes (Signed)
64 y.o. G2P2 Married  Caucasian Fe here for annual exam.  Mother now in Hospice for leukemia and CHF.  She is doing OK.  Husband plans to retire end of the year.  Patient's last menstrual period was 04/16/2004.          Sexually active: Yes.    The current method of family planning is post menopausal status.    Exercising: No.  The patient does not participate in regular exercise at present. Smoker:  no  Health Maintenance: Pap: 03/2005 Negative  MMG:  11/22/13 BIRADS1:Neg Self Breast Exam: No Colonoscopy:  2007 polyps - repeat 5 years  BMD:   11/2013 T Score: -2.4, -2.7 left hip;  on Prolia every 6 months  TDaP:  2015  Labs: will do at PCP UA: Negative   reports that she has never smoked. She has never used smokeless tobacco. She reports that she drinks about 0.5 oz of alcohol per week. She reports that she does not use illicit drugs.  Past Medical History  Diagnosis Date  . Menopausal and perimenopausal disorder   . Osteoporosis   . Hemorrhoids     Past Surgical History  Procedure Laterality Date  . Abdominal hysterectomy  04/2004    TVH/BSO  . Tubal ligation      BTL  . Vaginal delivery      SVD x3  . Cataract extraction extracapsular Bilateral 4/15, 5/15    Current Outpatient Prescriptions  Medication Sig Dispense Refill  . aspirin 81 MG tablet Take 81 mg by mouth daily.    . cetirizine (ZYRTEC) 5 MG tablet Take 5 mg by mouth daily.    Marland Kitchen denosumab (PROLIA) 60 MG/ML SOLN injection Inject 60 mg into the skin once. Administer in upper arm, thigh, or abdomen 1 Syringe 1  . fish oil-omega-3 fatty acids 1000 MG capsule Take 2 g by mouth daily.    Marland Kitchen lidocaine (XYLOCAINE) 5 % ointment As needed  0  . Multiple Vitamin (MULTIVITAMIN) tablet Take 1 tablet by mouth daily.    . pramoxine (PROCTOFOAM) 1 % foam Place 1 application rectally 3 (three) times daily as needed for itching. 15 g 1  . psyllium (REGULOID) 0.52 G capsule Take 0.52 g by mouth daily.     No current  facility-administered medications for this visit.    Family History  Problem Relation Age of Onset  . Osteoporosis Mother   . Hypertension Mother     ROS:  Pertinent items are noted in HPI.  Otherwise, a comprehensive ROS was negative.  Exam:   BP 118/70 mmHg  Pulse 92  Resp 20  Ht 5' 5.25" (1.657 m)  Wt 162 lb (73.483 kg)  BMI 26.76 kg/m2  LMP 04/16/2004 Height: 5' 5.25" (165.7 cm) Ht Readings from Last 3 Encounters:  07/24/14 5' 5.25" (1.657 m)  05/07/14 5' 5.5" (1.664 m)  11/28/13 5' 5.5" (1.664 m)    General appearance: alert, cooperative and appears stated age Head: Normocephalic, without obvious abnormality, atraumatic Neck: no adenopathy, supple, symmetrical, trachea midline and thyroid normal to inspection and palpation Lungs: clear to auscultation bilaterally Breasts: normal appearance, no masses or tenderness Heart: regular rate and rhythm Abdomen: soft, non-tender; no masses,  no organomegaly Extremities: extremities normal, atraumatic, no cyanosis or edema Skin: Skin color, texture, turgor normal. No rashes or lesions Lymph nodes: Cervical, supraclavicular, and axillary nodes normal. No abnormal inguinal nodes palpated Neurologic: Grossly normal   Pelvic: External genitalia:  no lesions  Urethra:  normal appearing urethra with no masses, tenderness or lesions              Bartholin's and Skene's: normal                 Vagina: normal appearing vagina with normal color and discharge, no lesions              Cervix: absent              Pap taken: No. Bimanual Exam:  Uterus:  uterus absent              Adnexa: no mass, fullness, tenderness               Rectovaginal: Confirms               Anus:  normal sphincter tone, no lesions  Chaperone present: No  A:  Well Woman with normal exam  S/P TVH, BSO secondary to fibroids 04/2004 ERT from 04/2004 - 07/2012 Mild grade I cystocele Osteoporosis - now on Prolia  per Dr. Karle Starch  History of Hemorrhoids  P:   Reviewed health and wellness pertinent to exam  Pap smear as above  Mammogram is due 10/16  Counseled on breast self exam, mammography screening, adequate intake of calcium and vitamin D, diet and exercise return annually or prn  An After Visit Summary was printed and given to the patient.

## 2014-07-25 NOTE — Progress Notes (Signed)
Encounter reviewed by Dr. Myers Tutterow Silva.  

## 2014-10-12 ENCOUNTER — Telehealth: Payer: Self-pay | Admitting: Emergency Medicine

## 2014-10-12 DIAGNOSIS — Z01812 Encounter for preprocedural laboratory examination: Secondary | ICD-10-CM

## 2014-10-12 NOTE — Telephone Encounter (Signed)
Call to patient.  Last prolia injection 05/07/14. Patient ready to schedule next injection. No insurance changes. No prior authorization required per [facility].  Patient scheduled for lab appointment 10/29/14 for calcium and advised with normal calcium results, will send in prescription for Prolia. Patient picks up prolia from [facility] and does have one refill available.   Lab order appointment scheduled. Patient agreeable.

## 2014-10-29 ENCOUNTER — Other Ambulatory Visit (INDEPENDENT_AMBULATORY_CARE_PROVIDER_SITE_OTHER): Payer: Medicare Other

## 2014-10-29 DIAGNOSIS — Z01812 Encounter for preprocedural laboratory examination: Secondary | ICD-10-CM | POA: Diagnosis not present

## 2014-10-29 LAB — CALCIUM: Calcium: 9.1 mg/dL (ref 8.6–10.4)

## 2014-10-31 NOTE — Telephone Encounter (Signed)
Message left to return call to Virl Coble at [phone].    

## 2014-10-31 NOTE — Telephone Encounter (Signed)
-----   Message from Megan Salon, MD sent at 10/29/2014  5:16 PM EDT ----- Ok to proceed with prolia.

## 2014-10-31 NOTE — Telephone Encounter (Signed)
Called patient. She is advised of normal calcium and okay for Prolia injection.   She has refill at [facility] available and will call them for refill order.  She is scheduled for nurse visit 11/07/14 and will call if order will not be in on time.  She will call back if any changes.  Routing to provider for final review. Patient agreeable to disposition. Will close encounter.

## 2014-11-07 ENCOUNTER — Ambulatory Visit (INDEPENDENT_AMBULATORY_CARE_PROVIDER_SITE_OTHER): Payer: Medicare Other | Admitting: *Deleted

## 2014-11-07 VITALS — BP 116/78 | HR 84 | Resp 16 | Ht 65.25 in | Wt 163.0 lb

## 2014-11-07 DIAGNOSIS — M810 Age-related osteoporosis without current pathological fracture: Secondary | ICD-10-CM

## 2014-11-07 MED ORDER — DENOSUMAB 60 MG/ML ~~LOC~~ SOLN
60.0000 mg | Freq: Once | SUBCUTANEOUS | Status: AC
  Administered 2014-11-07: 60 mg via SUBCUTANEOUS

## 2014-11-07 NOTE — Progress Notes (Signed)
Patient in today for second prolia injection. Patient tolerated shot well. Waited in the office for 10 minutes and was released without complications.  Patient to call in ~ 5 months to schedule calcium lab and third prolia injection.  Encounter closed.

## 2015-03-12 DIAGNOSIS — Z961 Presence of intraocular lens: Secondary | ICD-10-CM | POA: Diagnosis not present

## 2015-03-12 DIAGNOSIS — H16223 Keratoconjunctivitis sicca, not specified as Sjogren's, bilateral: Secondary | ICD-10-CM | POA: Diagnosis not present

## 2015-04-22 ENCOUNTER — Telehealth: Payer: Self-pay | Admitting: Obstetrics & Gynecology

## 2015-04-22 DIAGNOSIS — Z01812 Encounter for preprocedural laboratory examination: Secondary | ICD-10-CM

## 2015-04-22 NOTE — Telephone Encounter (Signed)
Thank you for scheduling the Prolia injection.

## 2015-04-22 NOTE — Telephone Encounter (Signed)
Call to patient. She is ready to plan Prolia injection.  Last injection 11/07/14.  Next injection due 05/07/15 (181 days).  Patient orders her Prolia to [place] for pick up.  Last Dexa Bone Density 11/22/13.   No prior authorization required per [facility] on covermymeds Key ID [id number]  Patient coming tomorrow 04/23/14 for calcium lab draw.   Routing to Dr. Quincy Simmonds as covering provider.

## 2015-04-22 NOTE — Telephone Encounter (Signed)
Patient has questions regarding prolia.

## 2015-04-23 ENCOUNTER — Other Ambulatory Visit (INDEPENDENT_AMBULATORY_CARE_PROVIDER_SITE_OTHER): Payer: Medicare Other

## 2015-04-23 DIAGNOSIS — Z01812 Encounter for preprocedural laboratory examination: Secondary | ICD-10-CM | POA: Diagnosis not present

## 2015-04-23 LAB — CALCIUM: Calcium: 9.2 mg/dL (ref 8.6–10.4)

## 2015-04-26 MED ORDER — DENOSUMAB 60 MG/ML ~~LOC~~ SOLN: 60.0000 mg | Freq: Once | SUBCUTANEOUS | Status: DC

## 2015-04-26 NOTE — Telephone Encounter (Signed)
-----   Message from Salvadore Dom, MD sent at 04/24/2015  2:11 PM EST ----- Please advise the patient of normal results.

## 2015-04-26 NOTE — Telephone Encounter (Signed)
Call to patient and she is advised of normal Calcium Results.   Patient prefers to pick up prescription at [facility]. Order for Prolia sent for patient pick up on 05/07/15 in the morning before nurse appointment.  Order sent . Advised patient to call back with any concerns prior to nurse appointment on 05/07/15. Patient agreeable.  Routing to provider for final review. Patient agreeable to disposition. Will close encounter.

## 2015-05-07 ENCOUNTER — Ambulatory Visit (INDEPENDENT_AMBULATORY_CARE_PROVIDER_SITE_OTHER): Payer: Medicare Other | Admitting: *Deleted

## 2015-05-07 VITALS — BP 122/76 | HR 88 | Resp 18 | Ht 65.25 in | Wt 171.0 lb

## 2015-05-07 DIAGNOSIS — M810 Age-related osteoporosis without current pathological fracture: Secondary | ICD-10-CM

## 2015-05-07 MED ORDER — DENOSUMAB 60 MG/ML ~~LOC~~ SOLN
60.0000 mg | Freq: Once | SUBCUTANEOUS | Status: AC
  Administered 2015-05-07: 60 mg via SUBCUTANEOUS

## 2015-05-07 NOTE — Progress Notes (Signed)
Patient in for Prolia injection.   Right lower abdomen subcutaneous today. Patient tolerate shot well.

## 2015-07-25 ENCOUNTER — Telehealth: Payer: Self-pay | Admitting: Nurse Practitioner

## 2015-07-25 NOTE — Telephone Encounter (Signed)
Patient needs a bone density order to be sent to [facility]. She can not schedule her appointment until they receive the order.

## 2015-07-25 NOTE — Telephone Encounter (Signed)
Last annual exam 07-24-2014 with Edman Circle, FNP. Last BMD 11-2013. Patient on Prolia for osteoporosis and 4 th injection is due 10-2015.  Call to patient . States she wants to proceed with scheduling BMD and MMG for early October following last Prolia. Advised due for annual and can discuss BMD at that time as MD may want to discuss timing of BMD after injection. Patient declines annual due to illness with sister and wellness exam not covered with medicare. Advised patient that we will have to have office visit in order to continue to prescribe treatment. Will review with provider.  Advised will review with Dr Sabra Heck timing of BMD following Prolia and call her back.

## 2015-07-26 NOTE — Telephone Encounter (Signed)
She does need to be seen.  OV is fine.  Can do blood work at as well.  Should repeat BMD 2 full years after staring Prolia which was March of 2016.  So, I would recommend doing it in March, 2018.  Thanks.

## 2015-08-01 NOTE — Telephone Encounter (Signed)
Call to patient. Advised of instructions from Dr Sabra Heck. Consult and lab visit scheduled with Dr Sabra Heck for 10-24-15 in preparation for next Prolia injection due 11-07-15.  Patient is advised this is problem visit only related to Prolia and will need annual exam at later date. Advised should have BMD in 04-2016, 2 full years after start of Prolia therapy. Patient agreeable.   Encounter closed.

## 2015-10-22 DIAGNOSIS — K644 Residual hemorrhoidal skin tags: Secondary | ICD-10-CM | POA: Diagnosis not present

## 2015-10-22 DIAGNOSIS — K648 Other hemorrhoids: Secondary | ICD-10-CM | POA: Diagnosis not present

## 2015-10-24 ENCOUNTER — Ambulatory Visit (INDEPENDENT_AMBULATORY_CARE_PROVIDER_SITE_OTHER): Payer: Medicare Other | Admitting: Obstetrics & Gynecology

## 2015-10-24 VITALS — BP 120/80 | HR 80 | Resp 16 | Ht 65.25 in | Wt 171.4 lb

## 2015-10-24 DIAGNOSIS — Z205 Contact with and (suspected) exposure to viral hepatitis: Secondary | ICD-10-CM | POA: Diagnosis not present

## 2015-10-24 DIAGNOSIS — M810 Age-related osteoporosis without current pathological fracture: Secondary | ICD-10-CM

## 2015-10-24 DIAGNOSIS — H269 Unspecified cataract: Secondary | ICD-10-CM

## 2015-10-24 DIAGNOSIS — R5383 Other fatigue: Secondary | ICD-10-CM

## 2015-10-24 LAB — CBC
HCT: 42 % (ref 35.0–45.0)
Hemoglobin: 14.1 g/dL (ref 11.7–15.5)
MCH: 29.7 pg (ref 27.0–33.0)
MCHC: 33.6 g/dL (ref 32.0–36.0)
MCV: 88.6 fL (ref 80.0–100.0)
MPV: 10.9 fL (ref 7.5–12.5)
Platelets: 237 10*3/uL (ref 140–400)
RBC: 4.74 MIL/uL (ref 3.80–5.10)
RDW: 13.1 % (ref 11.0–15.0)
WBC: 5.8 10*3/uL (ref 3.8–10.8)

## 2015-10-24 NOTE — Progress Notes (Signed)
GYNECOLOGY  VISIT   HPI: 65 y.o. G2P2 Married Caucasian female here to discuss Prolia and her last BMD 10/15 showing osteoporosis -2.7  She has received three injections (05/07/14, 11/07/14, and 05/07/15) and is scheduled to received the fourth but she has not been seen in over a year and she also has some questions about this.  Pt reports she felt like she's had a lot of fatigue with the injection.  At first, it didn't seem that way but with each injection, she's had more fatigue that lasts longer.  She's only started to feel "back to normal" since the last injection in March.  She is worried this will be worse with the next injection.  Also, she reports having issues with skin dryness and cracking at the edges of her lips.  She has been using a variety of topical products and she reports it took about three months to finally heal.  This sounded like a perioral dermatitis.  I did review side effects with her and these are possible side effects of the Prolia.  She would like to make sure there aren't other causes for the fatigue.  We discussed testing for hep C, as well, since she has not had this done and will have blood work today.  Pt comfortable with this.  As she has received three injections, I feel it is reasonable for her to just see where her BMD is and see if she can be on a drug holiday.  If there is still osteoporosis, we can discuss other options.    GYNECOLOGIC HISTORY: Patient's last menstrual period was 04/16/2004.  Past Medical History:  Diagnosis Date  . Hemorrhoids   . Menopausal and perimenopausal disorder   . Osteoporosis     Past Surgical History:  Procedure Laterality Date  . ABDOMINAL HYSTERECTOMY  04/2004   TVH/BSO  . CATARACT EXTRACTION EXTRACAPSULAR Bilateral 4/15, 5/15  . TUBAL LIGATION     BTL  . VAGINAL DELIVERY     SVD x3    MEDS:  Reviewed in EPIC and UTD  ALLERGIES: Review of patient's allergies indicates no known allergies.  Family History  Problem  Relation Age of Onset  . Osteoporosis Mother   . Hypertension Mother    SH:  Married, non smoker  Review of Systems  All other systems reviewed and are negative.   PHYSICAL EXAMINATION:    BP 120/80 (BP Location: Right Arm, Patient Position: Sitting, Cuff Size: Normal)   Pulse 80   Resp 16   Ht 5' 5.25" (1.657 m)   Wt 171 lb 6.4 oz (77.7 kg)   LMP 04/16/2004   BMI 28.30 kg/m     General appearance: alert, cooperative and appears stated age Neck: no adenopathy, supple, symmetrical, trachea midline and thyroid normal to inspection and palpation CV:  Regular rate and rhythm Lungs:  clear to auscultation, no wheezes, rales or rhonchi, symmetric air entry No other exam performed  Assessment: Osteoporosis with side effects that seem to be related to the Prolia which she's been on for 18 months. Fatigue  Plan: CBC and TSH Hep C antibody Hold on giving 4th injection and plan BMD after 11/24/15.  Order placed for this.  Pt will call to schedule.     ~20 minutes spent with patient >50% of time was in face to face discussion of above.

## 2015-10-25 LAB — HEPATITIS C ANTIBODY: HCV Ab: NEGATIVE

## 2015-10-25 LAB — TSH: TSH: 1.37 mIU/L

## 2015-11-01 ENCOUNTER — Encounter: Payer: Self-pay | Admitting: Obstetrics & Gynecology

## 2015-11-01 DIAGNOSIS — H269 Unspecified cataract: Secondary | ICD-10-CM | POA: Insufficient documentation

## 2015-11-01 DIAGNOSIS — M810 Age-related osteoporosis without current pathological fracture: Secondary | ICD-10-CM | POA: Insufficient documentation

## 2015-11-05 ENCOUNTER — Other Ambulatory Visit: Payer: Self-pay | Admitting: General Surgery

## 2015-11-07 ENCOUNTER — Ambulatory Visit: Payer: Medicare Other

## 2015-11-14 ENCOUNTER — Encounter (HOSPITAL_BASED_OUTPATIENT_CLINIC_OR_DEPARTMENT_OTHER): Payer: Self-pay | Admitting: *Deleted

## 2015-11-14 NOTE — Progress Notes (Signed)
NPO AFTER MN.  ARRIVE AT 0730.  NEEDS HG.

## 2015-11-19 ENCOUNTER — Encounter (HOSPITAL_BASED_OUTPATIENT_CLINIC_OR_DEPARTMENT_OTHER)
Admission: RE | Disposition: A | Discharge status: Home / Self Care | Payer: Self-pay | Source: Ambulatory Visit | Attending: General Surgery

## 2015-11-19 ENCOUNTER — Ambulatory Visit (HOSPITAL_BASED_OUTPATIENT_CLINIC_OR_DEPARTMENT_OTHER): Payer: Medicare Other | Admitting: Anesthesiology

## 2015-11-19 ENCOUNTER — Encounter (HOSPITAL_BASED_OUTPATIENT_CLINIC_OR_DEPARTMENT_OTHER): Payer: Self-pay

## 2015-11-19 ENCOUNTER — Other Ambulatory Visit: Payer: Self-pay

## 2015-11-19 ENCOUNTER — Ambulatory Visit (HOSPITAL_BASED_OUTPATIENT_CLINIC_OR_DEPARTMENT_OTHER)
Admission: RE | Admit: 2015-11-19 | Discharge: 2015-11-19 | Disposition: A | Discharge status: Home / Self Care | Payer: Medicare Other | Source: Ambulatory Visit | Attending: General Surgery | Admitting: General Surgery

## 2015-11-19 DIAGNOSIS — K644 Residual hemorrhoidal skin tags: Secondary | ICD-10-CM | POA: Insufficient documentation

## 2015-11-19 DIAGNOSIS — Z90710 Acquired absence of both cervix and uterus: Secondary | ICD-10-CM | POA: Insufficient documentation

## 2015-11-19 DIAGNOSIS — K649 Unspecified hemorrhoids: Secondary | ICD-10-CM | POA: Diagnosis not present

## 2015-11-19 DIAGNOSIS — Z87891 Personal history of nicotine dependence: Secondary | ICD-10-CM | POA: Diagnosis not present

## 2015-11-19 DIAGNOSIS — Z7982 Long term (current) use of aspirin: Secondary | ICD-10-CM | POA: Insufficient documentation

## 2015-11-19 DIAGNOSIS — Z79899 Other long term (current) drug therapy: Secondary | ICD-10-CM | POA: Diagnosis not present

## 2015-11-19 DIAGNOSIS — K648 Other hemorrhoids: Secondary | ICD-10-CM | POA: Diagnosis not present

## 2015-11-19 HISTORY — DX: Other specified postprocedural states: R11.2

## 2015-11-19 HISTORY — PX: HEMORRHOID SURGERY: SHX153

## 2015-11-19 HISTORY — DX: Personal history of other benign neoplasm: Z86.018

## 2015-11-19 HISTORY — DX: Other specified postprocedural states: Z98.890

## 2015-11-19 HISTORY — DX: Residual hemorrhoidal skin tags: K64.4

## 2015-11-19 HISTORY — DX: Other hemorrhoids: K64.8

## 2015-11-19 LAB — POCT HEMOGLOBIN-HEMACUE: Hemoglobin: 15 g/dL (ref 12.0–15.0)

## 2015-11-19 SURGERY — HEMORRHOIDECTOMY
Anesthesia: Monitor Anesthesia Care | Site: Anus

## 2015-11-19 MED ORDER — SCOPOLAMINE 1 MG/3DAYS TD PT72
MEDICATED_PATCH | TRANSDERMAL | Status: AC
  Filled 2015-11-19: qty 1

## 2015-11-19 MED ORDER — DEXAMETHASONE SODIUM PHOSPHATE 10 MG/ML IJ SOLN
INTRAMUSCULAR | Status: AC
  Filled 2015-11-19: qty 1

## 2015-11-19 MED ORDER — PROPOFOL 500 MG/50ML IV EMUL
INTRAVENOUS | Status: DC | PRN
  Administered 2015-11-19: 200 ug/kg/min via INTRAVENOUS

## 2015-11-19 MED ORDER — OXYCODONE HCL 5 MG PO TABS: 5.0000 mg | ORAL_TABLET | ORAL | 0 refills | Status: DC | PRN

## 2015-11-19 MED ORDER — OXYCODONE HCL 5 MG PO TABS
5.0000 mg | ORAL_TABLET | Freq: Once | ORAL | Status: DC | PRN
  Filled 2015-11-19: qty 1

## 2015-11-19 MED ORDER — SCOPOLAMINE 1 MG/3DAYS TD PT72
1.0000 | MEDICATED_PATCH | TRANSDERMAL | Status: DC
  Administered 2015-11-19: 1 via TRANSDERMAL
  Administered 2015-11-19: 1.5 mg via TRANSDERMAL
  Filled 2015-11-19: qty 1

## 2015-11-19 MED ORDER — SODIUM CHLORIDE 0.9 % IV SOLN
250.0000 mL | INTRAVENOUS | Status: DC | PRN
  Filled 2015-11-19: qty 250

## 2015-11-19 MED ORDER — SODIUM CHLORIDE 0.9% FLUSH
3.0000 mL | INTRAVENOUS | Status: DC | PRN
  Filled 2015-11-19: qty 3

## 2015-11-19 MED ORDER — LIDOCAINE 5 % EX OINT
TOPICAL_OINTMENT | CUTANEOUS | Status: DC | PRN
  Administered 2015-11-19: 1

## 2015-11-19 MED ORDER — ONDANSETRON HCL 4 MG/2ML IJ SOLN
INTRAMUSCULAR | Status: DC | PRN
  Administered 2015-11-19: 4 mg via INTRAVENOUS

## 2015-11-19 MED ORDER — LACTATED RINGERS IV SOLN
INTRAVENOUS | Status: DC
  Filled 2015-11-19: qty 1000

## 2015-11-19 MED ORDER — PROMETHAZINE HCL 25 MG/ML IJ SOLN
6.2500 mg | INTRAMUSCULAR | Status: DC | PRN
Start: 2015-11-19 — End: 2015-11-19
  Filled 2015-11-19: qty 1

## 2015-11-19 MED ORDER — KETAMINE HCL 10 MG/ML IJ SOLN
INTRAMUSCULAR | Status: AC
  Filled 2015-11-19: qty 1

## 2015-11-19 MED ORDER — SODIUM CHLORIDE 0.9% FLUSH
3.0000 mL | Freq: Two times a day (BID) | INTRAVENOUS | Status: DC
  Filled 2015-11-19: qty 3

## 2015-11-19 MED ORDER — FENTANYL CITRATE (PF) 100 MCG/2ML IJ SOLN
INTRAMUSCULAR | Status: DC | PRN
  Administered 2015-11-19: 25 ug via INTRAVENOUS

## 2015-11-19 MED ORDER — BUPIVACAINE LIPOSOME 1.3 % IJ SUSP
INTRAMUSCULAR | Status: DC | PRN
  Administered 2015-11-19: 10:00:00

## 2015-11-19 MED ORDER — DIAZEPAM 5 MG PO TABS: 5.0000 mg | ORAL_TABLET | Freq: Four times a day (QID) | ORAL | 0 refills | Status: DC | PRN

## 2015-11-19 MED ORDER — FENTANYL CITRATE (PF) 100 MCG/2ML IJ SOLN
INTRAMUSCULAR | Status: AC
  Filled 2015-11-19: qty 2

## 2015-11-19 MED ORDER — LACTATED RINGERS IV SOLN
INTRAVENOUS | Status: DC
Start: 2015-11-19 — End: 2015-11-19
  Administered 2015-11-19 (×2): via INTRAVENOUS
  Filled 2015-11-19: qty 1000

## 2015-11-19 MED ORDER — DEXAMETHASONE SODIUM PHOSPHATE 4 MG/ML IJ SOLN
INTRAMUSCULAR | Status: DC | PRN
  Administered 2015-11-19: 10 mg via INTRAVENOUS

## 2015-11-19 MED ORDER — OXYCODONE HCL 5 MG PO TABS
5.0000 mg | ORAL_TABLET | ORAL | Status: DC | PRN
  Filled 2015-11-19: qty 2

## 2015-11-19 MED ORDER — ONDANSETRON HCL 4 MG/2ML IJ SOLN
INTRAMUSCULAR | Status: AC
  Filled 2015-11-19: qty 2

## 2015-11-19 MED ORDER — MIDAZOLAM HCL 2 MG/2ML IJ SOLN
INTRAMUSCULAR | Status: AC
  Filled 2015-11-19: qty 2

## 2015-11-19 MED ORDER — MEPERIDINE HCL 25 MG/ML IJ SOLN
6.2500 mg | INTRAMUSCULAR | Status: DC | PRN
  Filled 2015-11-19: qty 1

## 2015-11-19 MED ORDER — MIDAZOLAM HCL 5 MG/5ML IJ SOLN
INTRAMUSCULAR | Status: DC | PRN
  Administered 2015-11-19: 2 mg via INTRAVENOUS

## 2015-11-19 MED ORDER — FENTANYL CITRATE (PF) 100 MCG/2ML IJ SOLN
25.0000 ug | INTRAMUSCULAR | Status: DC | PRN
  Filled 2015-11-19: qty 1

## 2015-11-19 MED ORDER — BUPIVACAINE LIPOSOME 1.3 % IJ SUSP
20.0000 mL | INTRAMUSCULAR | Status: DC
  Filled 2015-11-19: qty 20

## 2015-11-19 MED ORDER — OXYCODONE HCL 5 MG/5ML PO SOLN
5.0000 mg | Freq: Once | ORAL | Status: DC | PRN
  Filled 2015-11-19: qty 5

## 2015-11-19 MED ORDER — LIDOCAINE 2% (20 MG/ML) 5 ML SYRINGE
INTRAMUSCULAR | Status: DC | PRN
  Administered 2015-11-19: 50 mg via INTRAVENOUS

## 2015-11-19 MED ORDER — ACETAMINOPHEN 650 MG RE SUPP
650.0000 mg | RECTAL | Status: DC | PRN
  Filled 2015-11-19: qty 1

## 2015-11-19 MED ORDER — BUPIVACAINE-EPINEPHRINE 0.5% -1:200000 IJ SOLN
INTRAMUSCULAR | Status: DC | PRN
  Administered 2015-11-19: 30 mL

## 2015-11-19 MED ORDER — ACETAMINOPHEN 325 MG PO TABS
650.0000 mg | ORAL_TABLET | ORAL | Status: DC | PRN
  Filled 2015-11-19: qty 2

## 2015-11-19 MED ORDER — SODIUM CHLORIDE 0.9 % IV SOLN
INTRAVENOUS | Status: DC | PRN
  Administered 2015-11-19: 10 ug/kg/min via INTRAVENOUS

## 2015-11-19 SURGICAL SUPPLY — 43 items
BLADE HEX COATED 2.75 (ELECTRODE) ×1 IMPLANT
BLADE SURG 10 STRL SS (BLADE) ×2 IMPLANT
BRIEF STRETCH FOR OB PAD LRG (UNDERPADS AND DIAPERS) IMPLANT
COVER BACK TABLE 60X90IN (DRAPES) ×2 IMPLANT
COVER MAYO STAND STRL (DRAPES) ×2 IMPLANT
DRAPE LAPAROTOMY 100X72 PEDS (DRAPES) ×2 IMPLANT
DRAPE UTILITY XL STRL (DRAPES) ×2 IMPLANT
DRSG PAD ABDOMINAL 8X10 ST (GAUZE/BANDAGES/DRESSINGS) ×1 IMPLANT
ELECT BLADE 6.5 .24CM SHAFT (ELECTRODE) ×2 IMPLANT
ELECT REM PT RETURN 9FT ADLT (ELECTROSURGICAL) ×2
ELECTRODE REM PT RTRN 9FT ADLT (ELECTROSURGICAL) ×1 IMPLANT
GAUZE SPONGE 4X4 16PLY XRAY LF (GAUZE/BANDAGES/DRESSINGS) IMPLANT
GLOVE BIO SURGEON STRL SZ 6.5 (GLOVE) ×2 IMPLANT
GLOVE BIOGEL PI IND STRL 7.5 (GLOVE) IMPLANT
GLOVE BIOGEL PI INDICATOR 7.5 (GLOVE) ×3
GLOVE INDICATOR 7.0 STRL GRN (GLOVE) ×2 IMPLANT
GOWN STRL REUS W/TWL 2XL LVL3 (GOWN DISPOSABLE) ×3 IMPLANT
GOWN STRL REUS W/TWL LRG LVL3 (GOWN DISPOSABLE) ×1 IMPLANT
KIT ROOM TURNOVER WOR (KITS) ×2 IMPLANT
NEEDLE HYPO 22GX1.5 SAFETY (NEEDLE) ×2 IMPLANT
NS IRRIG 500ML POUR BTL (IV SOLUTION) ×2 IMPLANT
PACK BASIN DAY SURGERY FS (CUSTOM PROCEDURE TRAY) ×2 IMPLANT
PAD ABD 8X10 STRL (GAUZE/BANDAGES/DRESSINGS) ×2 IMPLANT
PAD ARMBOARD 7.5X6 YLW CONV (MISCELLANEOUS) IMPLANT
PENCIL BUTTON HOLSTER BLD 10FT (ELECTRODE) ×2 IMPLANT
SPONGE GAUZE 4X4 12PLY (GAUZE/BANDAGES/DRESSINGS) ×2 IMPLANT
SPONGE GAUZE 4X4 12PLY STER LF (GAUZE/BANDAGES/DRESSINGS) ×1 IMPLANT
SPONGE SURGIFOAM ABS GEL 100 (HEMOSTASIS) IMPLANT
SPONGE SURGIFOAM ABS GEL 12-7 (HEMOSTASIS) IMPLANT
STAPLER VISISTAT 35W (STAPLE) ×1 IMPLANT
SUT CHROMIC 2 0 SH (SUTURE) IMPLANT
SUT CHROMIC 3 0 SH 27 (SUTURE) ×3 IMPLANT
SUT PROLENE 2 0 BLUE (SUTURE) IMPLANT
SUT VIC AB 2-0 SH 27 (SUTURE) ×6
SUT VIC AB 2-0 SH 27XBRD (SUTURE) IMPLANT
SUT VIC AB 4-0 P-3 18XBRD (SUTURE) IMPLANT
SUT VIC AB 4-0 P3 18 (SUTURE)
SUT VIC AB 4-0 SH 18 (SUTURE) IMPLANT
SYR CONTROL 10ML LL (SYRINGE) ×2 IMPLANT
TRAY DSU PREP LF (CUSTOM PROCEDURE TRAY) ×2 IMPLANT
TUBE CONNECTING 12X1/4 (SUCTIONS) ×2 IMPLANT
WATER STERILE IRR 500ML POUR (IV SOLUTION) IMPLANT
YANKAUER SUCT BULB TIP NO VENT (SUCTIONS) ×2 IMPLANT

## 2015-11-19 NOTE — Op Note (Addendum)
11/19/2015  11:17 AM  PATIENT:  Judy Donovan  65 y.o. female  Patient Care Team: No Pcp Per Patient as PCP - General (General Practice)  PRE-OPERATIVE DIAGNOSIS:  bleeding internal and external hemorrhoids  POST-OPERATIVE DIAGNOSIS:  bleeding internal and external hemorrhoids  PROCEDURE:  Procedure(s): 3 COLUMN HEMORRHOIDECTOMY  Surgeon(s): Leighton Ruff, MD  ASSISTANT: none  ANESTHESIA:   local and MAC  SPECIMEN:  Source of Specimen:  hemorrhoids  DISPOSITION OF SPECIMEN:  PATHOLOGY  COUNTS:  YES  PLAN OF CARE: Discharge to home after PACU  PATIENT DISPOSITION:  PACU - hemodynamically stable.  INDICATION: 65 year old female who has exhausted all medical options for treatment of her internal and external hemorrhoids. I recommended a complete hemorrhoidectomy. The risk and benefits of this were spine to the patient and consent was signed and placed on chart prior to the OR.   OR FINDINGS: Grade 2 internal hemorrhoids with significant skin tags externally   DESCRIPTION: the patient was identified in the preoperative holding area and taken to the OR where they were laid on the operating room table.  MAC anesthesia was induced without difficulty. The patient was then positioned in prone jackknife position with buttocks gently taped apart.  The patient was then prepped and draped in usual sterile fashion.  SCDs were noted to be in place prior to the initiation of anesthesia. A surgical timeout was performed indicating the correct patient, procedure, positioning and need for preoperative antibiotics.  A rectal block was performed using Marcaine with epinephrine.    I began with a digital rectal exam.  There were no palpable masses.  I then placed a Hill-Ferguson anoscope into the anal canal and evaluated this completely.  The patient had right-sided grade 2 internal hemorrhoids and a left-sided grade 1 hemorrhoid. There is significant skin tags in the right posterior and right  anterior regions. I began with the right posterior hemorrhoid. This was elevated with Kocher clamp. An incision was made in the skin around the hemorrhoid using a 10 blade scalpel. Metzenbaum scissors were used to separate this from the sphincters below. I then placed a clamp across the internal sphincter and transected the remaining hemorrhoid with a 10 blade scalpel. A 2-0 chromic suture was used to close the internal portion of this wound. A 3-0 chromic suture was used to close the external portion after the remaining hemorrhoid tissue was removed from the subdermal layer using sharp dissection. I then repeated this in the right anterior and left lateral positions. I then irrigated the anal canal with saline. Hemostasis was good. The area was then infused with Exparel.  Lidocaine ointment and a sterile dressing was applied. The patient was awakened from anesthesia and sent to the post anesthesia care unit in stable condition. All counts were correct per operating room staff.

## 2015-11-19 NOTE — Progress Notes (Signed)
Up to bathroom to void. Red liquid drainage noted from rectal area. Fresh dressing applied and ice pack to rectum. Denies pain. Will monitor.

## 2015-11-19 NOTE — Anesthesia Procedure Notes (Signed)
Procedure Name: MAC Date/Time: 11/19/2015 9:37 AM Performed by: Wanita Chamberlain Pre-anesthesia Checklist: Patient identified, Timeout performed, Emergency Drugs available, Suction available and Patient being monitored Patient Re-evaluated:Patient Re-evaluated prior to inductionOxygen Delivery Method: Nasal cannula Intubation Type: IV induction Placement Confirmation: positive ETCO2 Dental Injury: Teeth and Oropharynx as per pre-operative assessment

## 2015-11-19 NOTE — Discharge Instructions (Addendum)
ANORECTAL SURGERY: POST OP INSTRUCTIONS 1. Take your usually prescribed home medications unless otherwise directed. 2. DIET: During the first few hours after surgery sip on some liquids until you are able to urinate.  It is normal to not urinate for several hours after this surgery.  If you feel uncomfortable, please contact the office for instructions.  After you are able to urinate,you may eat, if you feel like it.  Follow a light bland diet the first 24 hours after arrival home, such as soup, liquids, crackers, etc.  Be sure to include lots of fluids daily (6-8 glasses).  Avoid fast food or heavy meals, as your are more likely to get nauseated.  Eat a low fat diet the next few days after surgery.  Limit caffeine intake to 1-2 servings a day. 3. PAIN CONTROL: a. Pain is best controlled by a usual combination of several different methods TOGETHER: i. Muscle relaxation 1.  Soak in a warm bath (or Sitz bath) three times a day and after bowel movements.  Continue to do this until all pain is resolved. 2. Take the muscle relaxer (Valium) every 6 hours for the first 2 days after surgery  ii. Over the counter pain medication iii. Prescription pain medication b. Most patients will experience some swelling and discomfort in the anus/rectal area and incisions.  Heat such as warm towels, sitz baths, warm baths, etc to help relax tight/sore spots and speed recovery.  Some people prefer to use ice, especially in the first couple days after surgery, as it may decrease the pain and swelling, or alternate between ice & heat.  Experiment to what works for you.  Swelling and bruising can take several weeks to resolve.  Pain can take even longer to completely resolve. c. It is helpful to take an over-the-counter pain medication regularly for the first few weeks.  Choose one of the following that works best for you: i. Naproxen (Aleve, etc)  Two 279m tabs twice a day ii. Ibuprofen (Advil, etc) Three 203mtabs four  times a day (every meal & bedtime) d. A  prescription for pain medication (such as percocet, oxycodone, hydrocodone, etc) should be given to you upon discharge.  Take your pain medication as prescribed.  i. If you are having problems/concerns with the prescription medicine (does not control pain, nausea, vomiting, rash, itching, etc), please call [phone] see if we need to switch you to a different pain medicine that will work better for you and/or control your side effect better. ii. If you need a refill on your pain medication, please contact your pharmacy.  They will contact our office to request authorization. Prescriptions will not be filled after 5 pm or on week-ends. 4. KEEP YOUR BOWELS REGULAR and AVOID CONSTIPATION a. The goal is one to two soft bowel movements a day.  You should at least have a bowel movement every other day. b. Avoid getting constipated.  Between the surgery and the pain medications, it is common to experience some constipation. This can be very painful after rectal surgery.  Increasing fluid intake and taking a fiber supplement (such as Metamucil, Citrucel, FiberCon, etc) 1-2 times a day regularly will usually help prevent this problem from occurring.  A stool softener like colace is also recommended.  This can be purchased over the counter at your pharmacy.  You can take it up to 3 times a day.  If you do not have a bowel movement after 24 hrs since your surgery,  take one does of milk of magnesia.  If you still haven't had a bowel movement 8-12 hours after that dose, take another dose.  If you don't have a bowel movement 48 hrs after surgery, purchase a Fleets enema from the drug store and administer gently per package instructions.  If you still are having trouble with your bowel movements after that, please call the office for further instructions. °c. If you develop diarrhea or have many loose bowel movements, simplify your diet to bland foods & liquids for a few  days.  Stop any stool softeners and decrease your fiber supplement.  Switching to mild anti-diarrheal medications (Kayopectate, Pepto Bismol) can help.  If this worsens or does not improve, please call us. ° °5. Wound Care °a. Remove your bandages before your first bowel movement or 8 hours after surgery.     °b. Remove any wound packing material at this tim,e as well.  You do not need to repack the wound unless instructed otherwise.  Wear an absorbent pad or soft cotton gauze in your underwear to catch any drainage and help keep the area clean. You should change this every 2-3 hours while awake. °c. Keep the area clean and dry.  Bathe / shower every day, especially after bowel movements.  Keep the area clean by showering / bathing over the incision / wound.   It is okay to soak an open wound to help wash it.  Wet wipes or showers / gentle washing after bowel movements is often less traumatic than regular toilet paper. °d. You may have some styrofoam-like soft packing in the rectum which will come out with the first bowel movement.  °e. You will often notice bleeding with bowel movements.  This should slow down by the end of the first week of surgery °f. Expect some drainage.  This should slow down, too, by the end of the first week of surgery.  Wear an absorbent pad or soft cotton gauze in your underwear until the drainage stops. °g. Do Not sit on a rubber or pillow ring.  This can make you symptoms worse.  You may sit on a soft pillow if needed.  °6. ACTIVITIES as tolerated:   °a. You may resume regular (light) daily activities beginning the next day--such as daily self-care, walking, climbing stairs--gradually increasing activities as tolerated.  If you can walk 30 minutes without difficulty, it is safe to try more intense activity such as jogging, treadmill, bicycling, low-impact aerobics, swimming, etc. °b. Save the most intensive and strenuous activity for last such as sit-ups, heavy lifting, contact sports,  etc  Refrain from any heavy lifting or straining until you are off narcotics for pain control.   °c. You may drive when you are no longer taking prescription pain medication, you can comfortably sit for long periods of time, and you can safely maneuver your car and apply brakes. °d. You may have sexual intercourse when it is comfortable.  °7. FOLLOW UP in our office °a. Please call [facility] at [phone] to set up an appointment to see your surgeon in the office for a follow-up appointment approximately 3-4 weeks after your surgery. °b. Make sure that you call for this appointment the day you arrive home to insure a convenient appointment time. °10. IF YOU HAVE DISABILITY OR FAMILY LEAVE FORMS, BRING THEM TO THE OFFICE FOR PROCESSING.  DO NOT GIVE THEM TO YOUR DOCTOR. ° ° ° ° °WHEN TO CALL US [phone]: °1. Poor pain control °  2. Reactions / problems with new medications (rash/itching, nausea, etc)  3. Fever over 101.5 F (38.5 C) 4. Inability to urinate 5. Nausea and/or vomiting 6. Worsening swelling or bruising 7. Continued bleeding from incision. 8. Increased pain, redness, or drainage from the incision  The clinic staff is available to answer your questions during regular business hours (8:30am-5pm).  Please dont hesitate to call and ask to speak to one of our nurses for clinical concerns.   A surgeon from [facility] is always on call at the hospitals   If you have a medical emergency, go to the nearest emergency room or call 911.    [facility], [place], [place], [place], [place]  [address] ? MAIN: [phone] ? TOLL FREE: [phone] ? FAX [phone] [email or web address]  Information for Discharge Teaching: EXPAREL (bupivacaine liposome injectable suspension)   Your surgeon gave you EXPAREL(bupivacaine) in your surgical incision to help control your pain after surgery.   EXPAREL is a local anesthetic that provides pain  relief by numbing the tissue around the surgical site.  EXPAREL is designed to release pain medication over time and can control pain for up to 72 hours.  Depending on how you respond to EXPAREL, you may require less pain medication during your recovery.  Possible side effects:  Temporary loss of sensation or ability to move in the area where bupivacaine was injected.  Nausea, vomiting, constipation  Rarely, numbness and tingling in your mouth or lips, lightheadedness, or anxiety may occur.  Call your doctor right away if you think you may be experiencing any of these sensations, or if you have other questions regarding possible side effects.  Follow all other discharge instructions given to you by your surgeon or nurse. Eat a healthy diet and drink plenty of water or other fluids.  If you return to the hospital for any reason within 96 hours following the administration of EXPAREL, please inform your health care providers.   Post Anesthesia Home Care Instructions  Activity: Get plenty of rest for the remainder of the day. A responsible adult should stay with you for 24 hours following the procedure.  For the next 24 hours, DO NOT: -Drive a car -Paediatric nurse -Drink alcoholic beverages -Take any medication unless instructed by your physician -Make any legal decisions or sign important papers.  Meals: Start with liquid foods such as gelatin or soup. Progress to regular foods as tolerated. Avoid greasy, spicy, heavy foods. If nausea and/or vomiting occur, drink only clear liquids until the nausea and/or vomiting subsides. Call your physician if vomiting continues.  Special Instructions/Symptoms: Your throat may feel dry or sore from the anesthesia or the breathing tube placed in your throat during surgery. If this causes discomfort, gargle with warm salt water. The discomfort should disappear within 24 hours.  If you had a scopolamine patch placed behind your ear for the  management of post- operative nausea and/or vomiting:  1. The medication in the patch is effective for 72 hours, after which it should be removed.  Wrap patch in a tissue and discard in the trash. Wash hands thoroughly with soap and water. 2. You may remove the patch earlier than 72 hours if you experience unpleasant side effects which may include dry mouth, dizziness or visual disturbances. 3. Avoid touching the patch. Wash your hands with soap and water after contact with the patch.

## 2015-11-19 NOTE — Progress Notes (Signed)
Dr. Marcello Moores aware of bloody drainage. No new orders. Amount has lessened.

## 2015-11-19 NOTE — H&P (Signed)
The patient is a 65 year old female who presents with hemorrhoids. Patient presents for follow up. She was seen in 2015. She has had several flares since then. She has had pain when she walks long distances. She has bleeding with some bowel movements and pain with every bowel movement. She denies any constipation and has soft stools and does not strain.    Problem List/Past Medical Leighton Ruff, MD; 0000000 11:19 AM) INTERNAL AND EXTERNAL BLEEDING HEMORRHOIDS (K64.4, K64.8) EXTERNAL HEMORRHOID (K64.4)  Other Problems Leighton Ruff, MD; 0000000 11:19 AM) No pertinent past medical history  Past Surgical History Leighton Ruff, MD; 0000000 11:18 AM) Cataract Surgery Bilateral. Colon Polyp Removal - Colonoscopy Hysterectomy (not due to cancer) - Complete  Diagnostic Studies History Leighton Ruff, MD; 0000000 11:18 AM) Mammogram within last year Colonoscopy 5-10 years ago  Allergies Mammie Lorenzo, LPN; QA348G QA348G AM) No Known Drug Allergies 01/15/2014  Medication History Mammie Lorenzo, LPN; QA348G 624THL AM) Probiotic (250MG  Capsule, Oral) Active. Lidocaine (5% Ointment, 1 application External as needed, Taken starting 01/19/2014) Active. Lidocaine-Prilocaine (2.5-2.5% Cream, 1 (one) Cream External three times daily, as needed, Taken starting 01/15/2014) Active. Baby Aspirin (81MG  Tablet Chewable, Oral) Active. Fish Oil + D3 (1000-1000MG -UNIT Capsule, Oral) Active. Pramox (1% Gel, External) Active. Pramoxine HCl Active. Psyllium (0.52GM Capsule, Oral) Active. Vitamin E (100UNIT Capsule, Oral) Active. Medications Reconciled  Social History Leighton Ruff, MD; 0000000 11:18 AM) Alcohol use Occasional alcohol use. No drug use Tobacco use Former smoker. Caffeine use Coffee, Tea.  Family History Leighton Ruff, MD; 0000000 11:18 AM) Hypertension Mother.  Pregnancy / Birth History Leighton Ruff, MD; 0000000 11:18 AM) Age  at menarche 81 years. Age of menopause 26-55 Gravida 2 Maternal age 36-30 Para 2 Irregular periods     Review of Systems Leighton Ruff MD; 0000000 11:18 AM) General Not Present- Appetite Loss, Chills, Fatigue, Fever, Night Sweats, Weight Gain and Weight Loss. Skin Not Present- Change in Wart/Mole, Dryness, Hives, Jaundice, New Lesions, Non-Healing Wounds, Rash and Ulcer. HEENT Not Present- Earache, Hearing Loss, Hoarseness, Nose Bleed, Oral Ulcers, Ringing in the Ears, Seasonal Allergies, Sinus Pain, Sore Throat, Visual Disturbances, Wears glasses/contact lenses and Yellow Eyes. Respiratory Not Present- Bloody sputum, Chronic Cough, Difficulty Breathing, Snoring and Wheezing. Breast Not Present- Breast Mass, Breast Pain, Nipple Discharge and Skin Changes. Cardiovascular Not Present- Chest Pain, Difficulty Breathing Lying Down, Leg Cramps, Palpitations, Rapid Heart Rate, Shortness of Breath and Swelling of Extremities. Gastrointestinal Present- Hemorrhoids and Rectal Pain. Not Present- Abdominal Pain, Bloating, Bloody Stool, Change in Bowel Habits, Chronic diarrhea, Constipation, Difficulty Swallowing, Excessive gas, Gets full quickly at meals, Indigestion, Nausea and Vomiting. Female Genitourinary Not Present- Frequency, Nocturia, Painful Urination, Pelvic Pain and Urgency. Musculoskeletal Not Present- Back Pain, Joint Pain, Joint Stiffness, Muscle Pain, Muscle Weakness and Swelling of Extremities. Neurological Not Present- Decreased Memory, Fainting, Headaches, Numbness, Seizures, Tingling, Tremor, Trouble walking and Weakness. Psychiatric Not Present- Anxiety, Bipolar, Change in Sleep Pattern, Depression, Fearful and Frequent crying. Endocrine Not Present- Cold Intolerance, Excessive Hunger, Hair Changes, Heat Intolerance, Hot flashes and New Diabetes. Hematology Not Present- Easy Bruising, Excessive bleeding, Gland problems, HIV and Persistent Infections.  Vitals Claiborne Billings  [place]ockery LPN; QA348G QA348G AM) 10/22/2015 10:56 AM Weight: 171.6 lb Height: 65in Body Surface Area: 1.85 m Body Mass Index: 28.56 kg/m  Temp.: 98.56F(Oral)  Pulse: 83 (Regular)  BP: 120/78 (Sitting, Left Arm, Standard)      Physical Exam Leighton Ruff MD; 0000000 11:18 AM)  General Mental Status-Alert. General Appearance-Consistent with stated  age. Hydration-Well hydrated. Voice-Normal.  Chest and Lung Exam Chest and lung exam reveals -quiet, even and easy respiratory effort with no use of accessory muscles and on auscultation, normal breath sounds, no adventitious sounds and normal vocal resonance. Inspection Chest Wall - Normal. Back - normal.  Cardiovascular Cardiovascular examination reveals -on palpation PMI is normal in location and amplitude, no palpable S3 or S4. Normal cardiac borders., normal heart sounds, regular rate and rhythm with no murmurs, carotid auscultation reveals no bruits and normal pedal pulses bilaterally.  Abdomen Note: Soft, nontender, nondistended, no masses  Rectal Note: circumferential external hemorrhoids without acute thrombosis, no severe active inflammation, digital rectal exam reveals 2 moderate sized internal hemorrhoids.   Results Leighton Ruff MD; 0000000 11:20 AM) Procedures  Name Value Date ANOSCOPY, DIAGNOSTIC [id number]) [ Hemorrhoids ] Procedure Other: Procedure: Anoscopy Surgeon: Marcello Moores After the risks and benefits were explained, verbal consent was obtained for above procedure. A medical assistant chaperone was present thoroughout the entire procedure. Anesthesia: none Diagnosis: rectal bleeding Findings: grade 3 R anterior and R posterior internal hemorrhoids. Circumferential skin tags with no obvious inflammation  Performed: 10/22/2015 11:19 AM    Assessment & Plan   INTERNAL AND EXTERNAL BLEEDING HEMORRHOIDS (K64.4) Impression: 65 year old female with long-standing  hemorrhoid disease who presents to the office with ongoing bleeding and pain with bowel movements. She has tried multiple fiber supplements and stool softeners and is having soft bowel movements and still having pain and bleeding. We discussed trans-hemorrhoidal dearterialization versus hemorrhoidectomy. The patient would rather undergo a complete hemorrhoidectomy despite understanding the risk of pain and discomfort after surgery.

## 2015-11-19 NOTE — Transfer of Care (Signed)
Immediate Anesthesia Transfer of Care Note  Patient: Judy Donovan  Procedure(s) Performed: Procedure(s): HEMORRHOIDECTOMY (N/A)  Patient Location: PACU  Anesthesia Type:MAC  Level of Consciousness: awake, alert , oriented and patient cooperative  Airway & Oxygen Therapy: Patient Spontanous Breathing and Patient connected to nasal cannula oxygen  Post-op Assessment: Report given to RN and Post -op Vital signs reviewed and stable  Post vital signs: Reviewed and stable  Last Vitals:  Vitals:   11/19/15 0739 11/19/15 1023  BP: 135/65 (!) (P) 146/81  Pulse: 91   Resp: 16 (P) 20  Temp: 36.4 C (P) 36.6 C    Last Pain:  Vitals:   11/19/15 0739  TempSrc: Oral      Patients Stated Pain Goal: 10 ([id number])  Complications: No apparent anesthesia complications

## 2015-11-19 NOTE — Anesthesia Postprocedure Evaluation (Signed)
Anesthesia Post Note  Patient: Judy Donovan  Procedure(s) Performed: Procedure(s) (LRB): HEMORRHOIDECTOMY (N/A)  Patient location during evaluation: PACU Anesthesia Type: MAC Level of consciousness: awake and alert Pain management: pain level controlled Vital Signs Assessment: post-procedure vital signs reviewed and stable Respiratory status: spontaneous breathing, nonlabored ventilation and respiratory function stable Cardiovascular status: stable and blood pressure returned to baseline Anesthetic complications: no    Last Vitals:  Vitals:   11/19/15 1045 11/19/15 1100  BP: (!) 153/78 (!) 164/73  Pulse: 96 95  Resp: (!) 21 20  Temp:      Last Pain:  Vitals:   11/19/15 0739  TempSrc: Oral                 Nilda Simmer

## 2015-11-19 NOTE — Progress Notes (Signed)
Up to bathroom for second void. Continues to have steady ooze of bloody drainage. Fresh dressing and ice pack applied. Dr. Marcello Moores paged.

## 2015-11-19 NOTE — Anesthesia Preprocedure Evaluation (Addendum)
Anesthesia Evaluation  Patient identified by MRN, date of birth, ID band Patient awake    Reviewed: Allergy & Precautions, NPO status , Patient's Chart, lab work & pertinent test results  History of Anesthesia Complications (+) PONV and history of anesthetic complications  Airway Mallampati: II  TM Distance: >3 FB Neck ROM: Full    Dental  (+) Teeth Intact, Dental Advisory Given,    Pulmonary former smoker,    breath sounds clear to auscultation       Cardiovascular negative cardio ROS   Rhythm:Regular Rate:Normal     Neuro/Psych negative neurological ROS  negative psych ROS   GI/Hepatic negative GI ROS, Neg liver ROS,   Endo/Other  negative endocrine ROS  Renal/GU negative Renal ROS  negative genitourinary   Musculoskeletal negative musculoskeletal ROS (+)   Abdominal   Peds negative pediatric ROS (+)  Hematology negative hematology ROS (+)   Anesthesia Other Findings   Reproductive/Obstetrics negative OB ROS                           Lab Results  Component Value Date   WBC 5.8 10/24/2015   HGB 14.1 10/24/2015   HCT 42.0 10/24/2015   MCV 88.6 10/24/2015   PLT 237 10/24/2015   Lab Results  Component Value Date   CREATININE 0.72 02/12/2014   BUN 15 02/12/2014   NA 142 02/12/2014   K 4.1 02/12/2014   CL 106 02/12/2014   CO2 27 02/12/2014   No results found for: INR, PROTIME  11/2015 EKG: normal sinus rhythm.  Anesthesia Physical Anesthesia Plan  ASA: II  Anesthesia Plan: MAC   Post-op Pain Management:    Induction: Intravenous  Airway Management Planned: Natural Airway and Nasal Cannula  Additional Equipment:   Intra-op Plan:   Post-operative Plan:   Informed Consent: I have reviewed the patients History and Physical, chart, labs and discussed the procedure including the risks, benefits and alternatives for the proposed anesthesia with the patient or  authorized representative who has indicated his/her understanding and acceptance.     Plan Discussed with: CRNA  Anesthesia Plan Comments:        Anesthesia Quick Evaluation

## 2015-11-20 ENCOUNTER — Encounter (HOSPITAL_BASED_OUTPATIENT_CLINIC_OR_DEPARTMENT_OTHER): Payer: Self-pay | Admitting: General Surgery

## 2015-11-26 ENCOUNTER — Other Ambulatory Visit: Payer: Self-pay | Admitting: Obstetrics & Gynecology

## 2015-11-26 DIAGNOSIS — Z1231 Encounter for screening mammogram for malignant neoplasm of breast: Secondary | ICD-10-CM

## 2015-12-25 IMAGING — MG MM SCREEN MAMMOGRAM BILATERAL
4 series · 4 of 4 positions shown · non-contrast
Comparison: Previous exam(s).

CLINICAL DATA: Screening.

EXAM:
DIGITAL SCREENING BILATERAL MAMMOGRAM WITH CAD

[R CC]
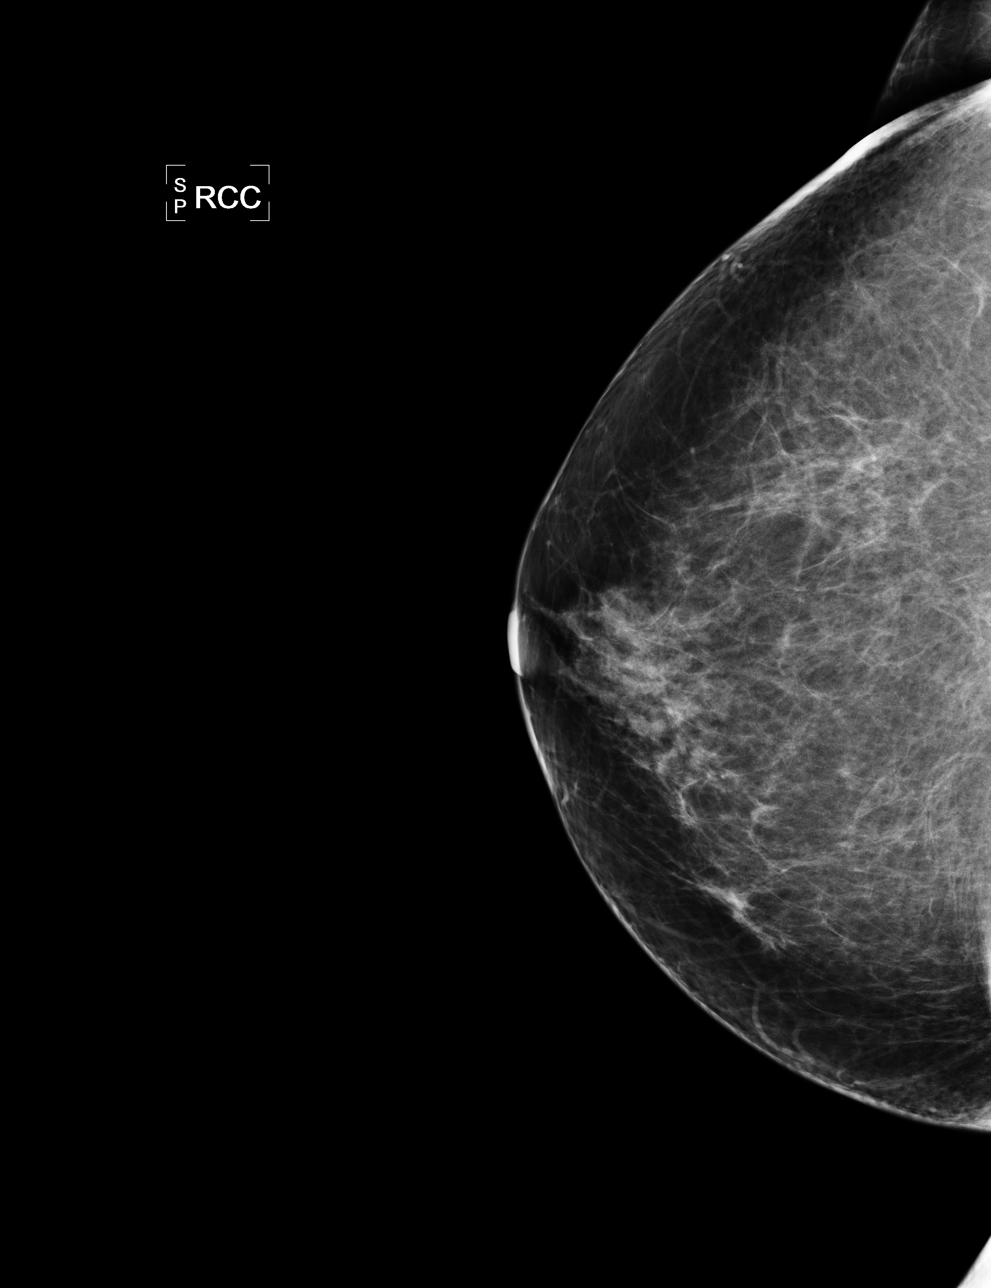

[L CC]
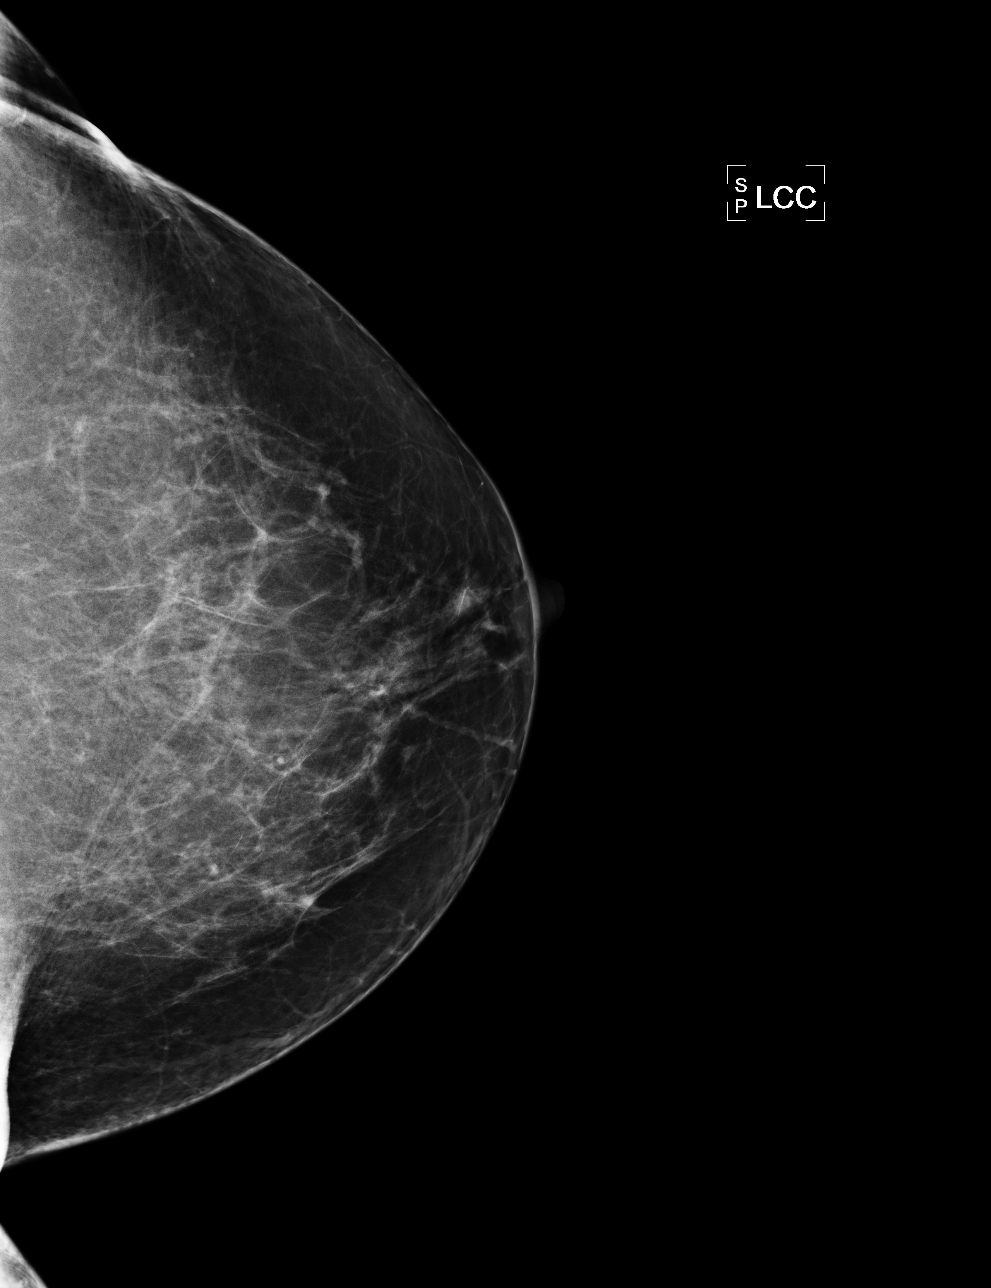

[L MLO]
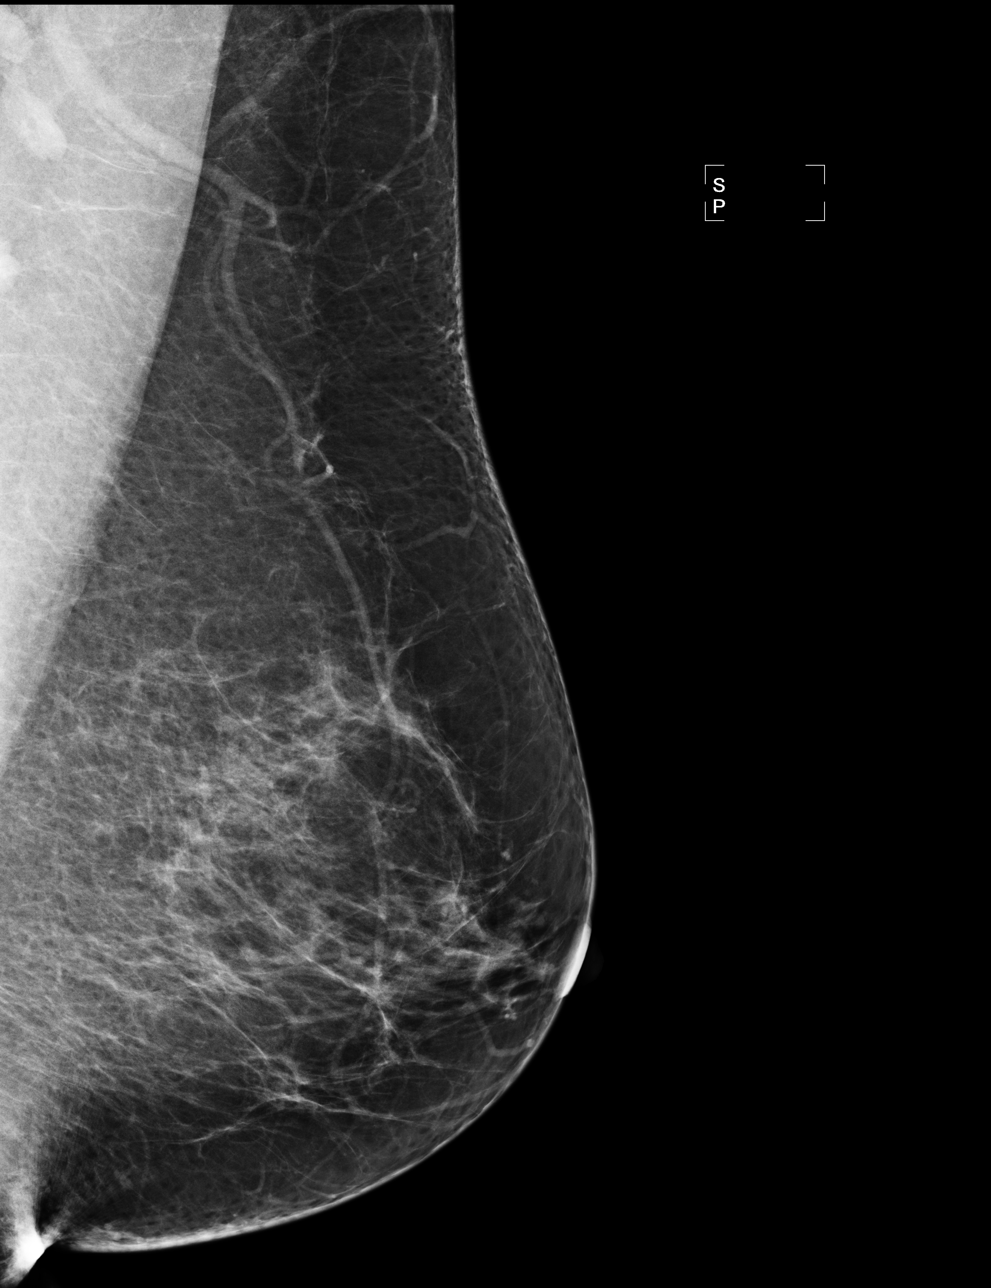

[R MLO]
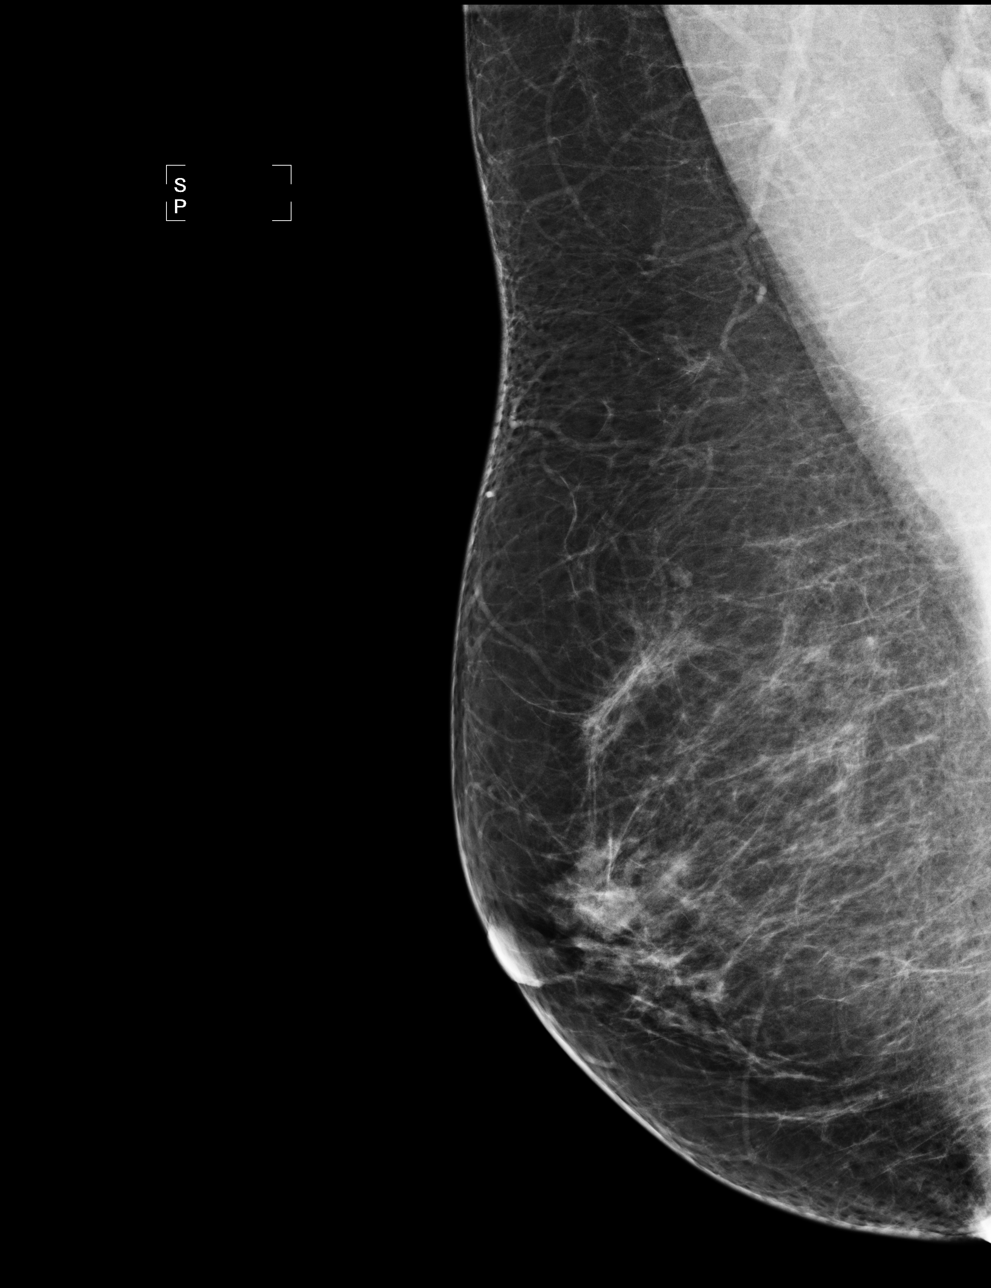

[4 of 4 positions shown; findings below may reference images not displayed]

ACR Breast Density Category b: There are scattered areas of
fibroglandular density.
FINDINGS: There are no findings suspicious for malignancy. Images were
processed with CAD.
IMPRESSION: No mammographic evidence of malignancy. A result letter of this
screening mammogram will be mailed directly to the patient.

RECOMMENDATION:
Screening mammogram in one year. (Code:AS-G-LCT)

BI-RADS CATEGORY  1: Negative.

## 2016-01-01 ENCOUNTER — Ambulatory Visit
Admission: RE | Admit: 2016-01-01 | Discharge: 2016-01-01 | Disposition: A | Discharge status: Home / Self Care | Payer: Medicare Other | Source: Ambulatory Visit | Attending: Obstetrics & Gynecology | Admitting: Obstetrics & Gynecology

## 2016-01-01 DIAGNOSIS — Z1231 Encounter for screening mammogram for malignant neoplasm of breast: Secondary | ICD-10-CM | POA: Diagnosis not present

## 2016-01-01 DIAGNOSIS — Z780 Asymptomatic menopausal state: Secondary | ICD-10-CM | POA: Diagnosis not present

## 2016-01-01 DIAGNOSIS — M8589 Other specified disorders of bone density and structure, multiple sites: Secondary | ICD-10-CM | POA: Diagnosis not present

## 2016-01-01 DIAGNOSIS — M810 Age-related osteoporosis without current pathological fracture: Secondary | ICD-10-CM

## 2016-05-26 DIAGNOSIS — Z23 Encounter for immunization: Secondary | ICD-10-CM | POA: Diagnosis not present

## 2016-07-06 DIAGNOSIS — H11432 Conjunctival hyperemia, left eye: Secondary | ICD-10-CM | POA: Diagnosis not present

## 2016-09-22 DIAGNOSIS — B370 Candidal stomatitis: Secondary | ICD-10-CM | POA: Diagnosis not present

## 2016-11-10 DIAGNOSIS — B370 Candidal stomatitis: Secondary | ICD-10-CM | POA: Diagnosis not present

## 2016-11-10 DIAGNOSIS — K130 Diseases of lips: Secondary | ICD-10-CM | POA: Diagnosis not present

## 2017-03-15 DIAGNOSIS — Z961 Presence of intraocular lens: Secondary | ICD-10-CM | POA: Diagnosis not present

## 2017-03-15 DIAGNOSIS — H16223 Keratoconjunctivitis sicca, not specified as Sjogren's, bilateral: Secondary | ICD-10-CM | POA: Diagnosis not present

## 2017-08-31 DIAGNOSIS — T3695XA Adverse effect of unspecified systemic antibiotic, initial encounter: Secondary | ICD-10-CM | POA: Diagnosis not present

## 2017-08-31 DIAGNOSIS — J0100 Acute maxillary sinusitis, unspecified: Secondary | ICD-10-CM | POA: Diagnosis not present

## 2017-08-31 DIAGNOSIS — B379 Candidiasis, unspecified: Secondary | ICD-10-CM | POA: Diagnosis not present

## 2018-03-16 DIAGNOSIS — H16223 Keratoconjunctivitis sicca, not specified as Sjogren's, bilateral: Secondary | ICD-10-CM | POA: Diagnosis not present

## 2021-02-27 ENCOUNTER — Other Ambulatory Visit (HOSPITAL_BASED_OUTPATIENT_CLINIC_OR_DEPARTMENT_OTHER): Payer: Self-pay | Admitting: Nurse Practitioner

## 2021-02-27 DIAGNOSIS — J0140 Acute pansinusitis, unspecified: Secondary | ICD-10-CM

## 2021-02-27 MED ORDER — AZITHROMYCIN 250 MG PO TABS: ORAL_TABLET | ORAL | 0 refills | Status: AC

## 2021-02-27 MED ORDER — AMOXICILLIN-POT CLAVULANATE 875-125 MG PO TABS: 1.0000 | ORAL_TABLET | Freq: Two times a day (BID) | ORAL | 0 refills | Status: DC

## 2021-06-10 ENCOUNTER — Ambulatory Visit (HOSPITAL_BASED_OUTPATIENT_CLINIC_OR_DEPARTMENT_OTHER): Payer: Medicare Other | Admitting: Family Medicine

## 2021-06-10 ENCOUNTER — Encounter (HOSPITAL_BASED_OUTPATIENT_CLINIC_OR_DEPARTMENT_OTHER): Payer: Self-pay | Admitting: Family Medicine

## 2021-06-10 ENCOUNTER — Ambulatory Visit (INDEPENDENT_AMBULATORY_CARE_PROVIDER_SITE_OTHER): Payer: Medicare Other | Admitting: Family Medicine

## 2021-06-10 DIAGNOSIS — E785 Hyperlipidemia, unspecified: Secondary | ICD-10-CM | POA: Diagnosis not present

## 2021-06-10 DIAGNOSIS — Z0000 Encounter for general adult medical examination without abnormal findings: Secondary | ICD-10-CM

## 2021-06-10 NOTE — Assessment & Plan Note (Signed)
No recent labs, plan to update labs with upcoming physical in about 1 to 2 months ?

## 2021-06-10 NOTE — Progress Notes (Signed)
On review of ? ?New Patient Office Visit ? ?Subjective   ? ?Patient ID: Judy Donovan, female    DOB: 1950/06/05  Age: 71 y.o. MRN: [id number] ? ?CC:  ?Chief Complaint  ?Patient presents with  ? New Patient (Initial Visit)  ? ? ?HPI ?Judy Donovan presents to establish care ?Last PCP was with Judy Donovan - has been several years since last visit ? ?Osteoporosis: Diagnosed in the past with DEXA completed through OB/GYN.  She had also been on Prolia, received 3 injections previously, did not have any further injections due to side effects related to her final injection. ? ?On review of chart, prior labs have shown hyperlipidemia with elevated total cholesterol and LDL.  This was several years ago. ? ?Patient is originally from [place]. Patient is retired - does some babysitting of her grandson. Patient enjoys painting, walking.  ? ?Outpatient Encounter Medications as of 06/10/2021  ?Medication Sig  ? aspirin 81 MG tablet Take 81 mg by mouth daily.  ? cetirizine (ZYRTEC) 5 MG tablet Take 5 mg by mouth as needed.   ? Multiple Vitamin (MULTIVITAMIN) tablet Take 1 tablet by mouth daily.  ? Omega-3 Fatty Acids (FISH OIL PO) Take by mouth daily. 1 teaspoon in pm  ? vitamin B-12 (CYANOCOBALAMIN) 500 MCG tablet Take 500 mcg by mouth daily. (Patient not taking: Reported on 06/10/2021)  ? [DISCONTINUED] diazepam (VALIUM) 5 MG tablet Take 1 tablet (5 mg total) by mouth every 6 (six) hours as needed (urinary retention). (Patient not taking: Reported on 06/10/2021)  ? [DISCONTINUED] oxyCODONE (OXY IR/ROXICODONE) 5 MG immediate release tablet Take 1-2 tablets (5-10 mg total) by mouth every 4 (four) hours as needed.  ? [DISCONTINUED] pramoxine (PROCTOFOAM) 1 % foam Place 1 application rectally 3 (three) times daily as needed for itching.  ? [DISCONTINUED] Saccharomyces boulardii (PROBIOTIC) 250 MG CAPS Take 1 capsule by mouth daily.   ? ?No facility-administered encounter medications on file as of 06/10/2021.  ? ? ?Past Medical  History:  ?Diagnosis Date  ? History of uterine fibroid   ? Internal and external hemorrhoids without complication   ? Osteoporosis   ? PONV (postoperative nausea and vomiting)   ? ? ?Past Surgical History:  ?Procedure Laterality Date  ? CATARACT EXTRACTION W/ INTRAOCULAR LENS  IMPLANT, BILATERAL  2015  ? HEMORRHOID SURGERY N/A 11/19/2015  ? Procedure: HEMORRHOIDECTOMY;  Surgeon: Leighton Ruff, MD;  Location: [facility];  Service: General;  Laterality: N/A;  ? TUBAL LIGATION Bilateral 1980's  ? VAGINAL HYSTERECTOMY  05/12/2004  ? w/  Bilateral Salpingoophorectomy  ? ? ?Family History  ?Problem Relation Age of Onset  ? Osteoporosis Mother   ? Hypertension Mother   ? ? ?Social History  ? ?Socioeconomic History  ? Marital status: Married  ?  Spouse name: Not on file  ? Number of children: Not on file  ? Years of education: Not on file  ? Highest education level: Not on file  ?Occupational History  ? Not on file  ?Tobacco Use  ? Smoking status: Former  ?  Years: 5.00  ?  Types: Cigarettes  ?  Quit date: 11/14/1978  ?  Years since quitting: 42.6  ? Smokeless tobacco: Never  ?Substance and Sexual Activity  ? Alcohol use: Yes  ?  Alcohol/week: 1.0 standard drink  ?  Types: 1 Standard drinks or equivalent per week  ?  Comment: occasional  ? Drug use: No  ? Sexual activity: Yes  ?  Birth control/protection: Surgical  ?  Other Topics Concern  ? Not on file  ?Social History Narrative  ? Not on file  ? ?Social Determinants of Health  ? ?Financial Resource Strain: Not on file  ?Food Insecurity: Not on file  ?Transportation Needs: Not on file  ?Physical Activity: Not on file  ?Stress: Not on file  ?Social Connections: Not on file  ?Intimate Partner Violence: Not on file  ? ? ?Objective   ? ?BP 137/79   Pulse 99   Temp 97.6 ?F (36.4 ?C) (Oral)   Ht '5\' 5"'$  (1.651 m)   Wt 171 lb 9.6 oz (77.8 kg)   LMP 04/16/2004   SpO2 98%   BMI 28.56 kg/m?  ? ?Physical Exam ? ?71 year old female in no acute  distress ?Cardiovascular exam with regular rate and rhythm, no murmur appreciated ?Lungs clear to auscultation bilaterally ? ?Assessment & Plan:  ? ?Problem List Items Addressed This Visit   ? ?  ? Other  ? Hyperlipidemia  ?  No recent labs, plan to update labs with upcoming physical in about 1 to 2 months ? ?  ?  ? Relevant Orders  ? CBC with Differential/Platelet  ? ?Other Visit Diagnoses   ? ? Wellness examination      ? Relevant Orders  ? CBC with Differential/Platelet  ? Comprehensive metabolic panel  ? Lipid panel  ? TSH Rfx on Abnormal to Free T4  ? ?  ? ? ?Return in about 6 weeks (around 07/22/2021) for CPE with FBW a few days prior.  ? ?Judy Donovan J [place], MD ? ? ?

## 2021-07-28 ENCOUNTER — Ambulatory Visit (HOSPITAL_BASED_OUTPATIENT_CLINIC_OR_DEPARTMENT_OTHER): Payer: Medicare Other

## 2021-07-28 ENCOUNTER — Other Ambulatory Visit (HOSPITAL_BASED_OUTPATIENT_CLINIC_OR_DEPARTMENT_OTHER): Payer: Self-pay

## 2021-07-28 DIAGNOSIS — Z0000 Encounter for general adult medical examination without abnormal findings: Secondary | ICD-10-CM

## 2021-07-28 DIAGNOSIS — E785 Hyperlipidemia, unspecified: Secondary | ICD-10-CM

## 2021-07-29 LAB — COMPREHENSIVE METABOLIC PANEL
ALT: 23 IU/L (ref 0–32)
AST: 25 IU/L (ref 0–40)
Albumin/Globulin Ratio: 1.3 (ref 1.2–2.2)
Albumin: 3.9 g/dL (ref 3.7–4.7)
Alkaline Phosphatase: 94 IU/L (ref 44–121)
BUN/Creatinine Ratio: 15 (ref 12–28)
BUN: 12 mg/dL (ref 8–27)
Bilirubin Total: 0.3 mg/dL (ref 0.0–1.2)
CO2: 24 mmol/L (ref 20–29)
Calcium: 9.2 mg/dL (ref 8.7–10.3)
Chloride: 103 mmol/L (ref 96–106)
Creatinine, Ser: 0.78 mg/dL (ref 0.57–1.00)
Globulin, Total: 3.1 g/dL (ref 1.5–4.5)
Glucose: 97 mg/dL (ref 70–99)
Potassium: 4 mmol/L (ref 3.5–5.2)
Sodium: 141 mmol/L (ref 134–144)
Total Protein: 7 g/dL (ref 6.0–8.5)
eGFR: 81 mL/min/{1.73_m2} (ref >59)

## 2021-07-29 LAB — CBC WITH DIFFERENTIAL/PLATELET
Basophils Absolute: 0.1 10*3/uL (ref 0.0–0.2)
Basos: 2 %
EOS (ABSOLUTE): 0.1 10*3/uL (ref 0.0–0.4)
Eos: 2 %
Hematocrit: 40.7 % (ref 34.0–46.6)
Hemoglobin: 13.7 g/dL (ref 11.1–15.9)
Immature Grans (Abs): 0 10*3/uL (ref 0.0–0.1)
Immature Granulocytes: 0 %
Lymphocytes Absolute: 1.2 10*3/uL (ref 0.7–3.1)
Lymphs: 33 %
MCH: 30.2 pg (ref 26.6–33.0)
MCHC: 33.7 g/dL (ref 31.5–35.7)
MCV: 90 fL (ref 79–97)
Monocytes Absolute: 0.4 10*3/uL (ref 0.1–0.9)
Monocytes: 10 %
Neutrophils Absolute: 2 10*3/uL (ref 1.4–7.0)
Neutrophils: 53 %
Platelets: 213 10*3/uL (ref 150–450)
RBC: 4.53 x10E6/uL (ref 3.77–5.28)
RDW: 11.9 % (ref 11.7–15.4)
WBC: 3.8 10*3/uL (ref 3.4–10.8)

## 2021-07-29 LAB — TSH RFX ON ABNORMAL TO FREE T4: TSH: 1.98 u[IU]/mL (ref 0.450–4.500)

## 2021-07-29 LAB — LIPID PANEL
Chol/HDL Ratio: 4.4 ratio (ref 0.0–4.4)
Cholesterol, Total: 247 mg/dL — ABNORMAL HIGH (ref 100–199)
HDL: 56 mg/dL (ref >39)
LDL Chol Calc (NIH): 175 mg/dL — ABNORMAL HIGH (ref 0–99)
Triglycerides: 91 mg/dL (ref 0–149)
VLDL Cholesterol Cal: 16 mg/dL (ref 5–40)

## 2021-08-05 ENCOUNTER — Encounter (HOSPITAL_BASED_OUTPATIENT_CLINIC_OR_DEPARTMENT_OTHER): Payer: Medicare Other | Admitting: Family Medicine

## 2021-08-06 ENCOUNTER — Encounter (HOSPITAL_BASED_OUTPATIENT_CLINIC_OR_DEPARTMENT_OTHER): Payer: Self-pay | Admitting: Family Medicine

## 2021-08-06 ENCOUNTER — Ambulatory Visit (INDEPENDENT_AMBULATORY_CARE_PROVIDER_SITE_OTHER): Payer: Medicare Other | Admitting: Family Medicine

## 2021-08-06 DIAGNOSIS — Z1231 Encounter for screening mammogram for malignant neoplasm of breast: Secondary | ICD-10-CM

## 2021-08-06 DIAGNOSIS — Z0000 Encounter for general adult medical examination without abnormal findings: Secondary | ICD-10-CM | POA: Diagnosis not present

## 2021-08-06 DIAGNOSIS — E785 Hyperlipidemia, unspecified: Secondary | ICD-10-CM

## 2021-08-06 MED ORDER — KETOCONAZOLE 2 % EX CREA: 1.0000 | TOPICAL_CREAM | Freq: Two times a day (BID) | CUTANEOUS | 1 refills | Status: DC

## 2021-08-06 NOTE — Assessment & Plan Note (Addendum)
Found to have hyperlipidemia on recent labs.  Discussed that current ASCVD risk score is about 12%, discussed meaning of this.  Discussed possibility of initiating statin therapy, continue with lifestyle modifications, CAC scoring for risk stratification She would prefer to continue with lifestyle modifications, conservative measures and plan to repeat lipid panel again in about 6 to 12 months for monitoring

## 2021-08-06 NOTE — Assessment & Plan Note (Addendum)
Routine HCM labs reviewed. HCM reviewed/discussed. Anticipatory guidance regarding healthy weight, lifestyle and choices given. Recommend healthy diet.  Recommend approximately 150 minutes/week of moderate intensity exercise Recommend regular dental and vision exams Always use seatbelt/lap and shoulder restraints Recommend using smoke alarms and checking batteries at least twice a year Recommend using sunscreen when outside Discussed colon cancer screening recommendations, options.  Patient will consider and let us know how he would like to proceed Discussed recommendations for shingles vaccine.  Patient declines at this time, will consider  Discussed tetanus immunization recommendations, patient is UTD Discussed recommendation for pneumococcal vaccination, declines at this time, will consider

## 2021-08-06 NOTE — Progress Notes (Signed)
Subjective:    CC: Annual Physical Exam  HPI:  Judy Donovan is a 71 y.o. presenting for annual physical  I reviewed the past medical history, family history, social history, surgical history, and allergies today and no changes were needed.  Please see the problem list section below in epic for further details.  Past Medical History: Past Medical History:  Diagnosis Date   History of uterine fibroid    Internal and external hemorrhoids without complication    Osteoporosis    PONV (postoperative nausea and vomiting)    Past Surgical History: Past Surgical History:  Procedure Laterality Date   CATARACT EXTRACTION W/ INTRAOCULAR LENS  IMPLANT, BILATERAL  2015   HEMORRHOID SURGERY N/A 11/19/2015   Procedure: HEMORRHOIDECTOMY;  Surgeon: Leighton Ruff, MD;  Location: [place];  Service: General;  Laterality: N/A;   TUBAL LIGATION Bilateral 1980's   VAGINAL HYSTERECTOMY  05/12/2004   w/  Bilateral Salpingoophorectomy   Social History: Social History   Socioeconomic History   Marital status: Married    Spouse name: Not on file   Number of children: Not on file   Years of education: Not on file   Highest education level: Not on file  Occupational History   Not on file  Tobacco Use   Smoking status: Former    Years: 5.00    Types: Cigarettes    Quit date: 11/14/1978    Years since quitting: 42.7   Smokeless tobacco: Never  Substance and Sexual Activity   Alcohol use: Yes    Alcohol/week: 1.0 standard drink of alcohol    Types: 1 Standard drinks or equivalent per week    Comment: occasional   Drug use: No   Sexual activity: Yes    Birth control/protection: Surgical  Other Topics Concern   Not on file  Social History Narrative   Not on file   Social Determinants of Health   Financial Resource Strain: Not on file  Food Insecurity: Not on file  Transportation Needs: Not on file  Physical Activity: Not on file  Stress: Not on file  Social  Connections: Not on file   Family History: Family History  Problem Relation Age of Onset   Osteoporosis Mother    Hypertension Mother    Allergies: Allergies  Allergen Reactions   Amoxicillin Rash   Medications: See med rec.  Review of Systems: No headache, visual changes, nausea, vomiting, diarrhea, constipation, dizziness, abdominal pain, skin rash, fevers, chills, night sweats, swollen lymph nodes, weight loss, chest pain, body aches, joint swelling, muscle aches, shortness of breath, mood changes, visual or auditory hallucinations.  Objective:    BP 128/68   Pulse (!) 101   Temp 97.8 F (36.6 C) (Oral)   Ht '5\' 5"'$  (1.651 m)   Wt 174 lb 3.2 oz (79 kg)   LMP 04/16/2004   SpO2 99%   BMI 28.99 kg/m   General: Well Developed, well nourished, and in no acute distress.  Neuro: Alert and oriented x3, extra-ocular muscles intact, sensation grossly intact. Cranial nerves II through XII are intact, motor, sensory, and coordinative functions are all intact. HEENT: Normocephalic, atraumatic, pupils equal round reactive to light, neck supple, no masses, no lymphadenopathy, thyroid nonpalpable. Oropharynx, nasopharynx, external ear canals are unremarkable. Skin: Warm and dry, no rashes noted. Scattered areas of increased redness along anterior neck and upper chest. Cardiac: Regular rate and rhythm, no murmurs rubs or gallops.  Respiratory: Clear to auscultation bilaterally. Not using accessory muscles, speaking in  full sentences.  Abdominal: Soft, nontender, nondistended, positive bowel sounds, no masses, no organomegaly.  Musculoskeletal: Shoulder, elbow, wrist, hip, knee, ankle stable, and with full range of motion.  Impression and Recommendations:    Wellness examination Routine HCM labs reviewed. HCM reviewed/discussed. Anticipatory guidance regarding healthy weight, lifestyle and choices given. Recommend healthy diet.  Recommend approximately 150 minutes/week of moderate intensity  exercise Recommend regular dental and vision exams Always use seatbelt/lap and shoulder restraints Recommend using smoke alarms and checking batteries at least twice a year Recommend using sunscreen when outside Discussed colon cancer screening recommendations, options.  Patient will consider and let us know how he would like to proceed Discussed recommendations for shingles vaccine.  Patient declines at this time, will consider  Discussed tetanus immunization recommendations, patient is UTD Discussed recommendation for pneumococcal vaccination, declines at this time, will consider  Hyperlipidemia Found to have hyperlipidemia on recent labs.  Discussed that current ASCVD risk score is about 12%, discussed meaning of this.  Discussed possibility of initiating statin therapy, continue with lifestyle modifications, CAC scoring for risk stratification She would prefer to continue with lifestyle modifications, conservative measures and plan to repeat lipid panel again in about 6 to 12 months for monitoring  Rash -reports that over the past few weeks.  Noticed a rash along anterior neck, upper chest.  Area will be intermittently red/itchy.  She has been trying over-the-counter creams and lotions without significant relief.  Did try over-the-counter steroid cream without notable improvement.  Discussed potential etiologies, will treat initially for underlying fungal etiology.  Ketoconazole cream sent to pharmacy on file.  Instructed on proper use of cream, can also continue with use of lotion/moisturizer.  If no notable improvement with antifungal then consider transitioning to steroid cream.  If continuing to have issues then likely refer to dermatology for further evaluation  Return if symptoms worsen or fail to improve.   ___________________________________________ Saifan Rayford de [place], MD, ABFM, CAQSM [facility] [place]

## 2021-08-13 ENCOUNTER — Ambulatory Visit
Admission: RE | Admit: 2021-08-13 | Discharge: 2021-08-13 | Disposition: A | Discharge status: Home / Self Care | Payer: Medicare Other | Source: Ambulatory Visit | Attending: Family Medicine | Admitting: Family Medicine

## 2021-08-13 DIAGNOSIS — Z1231 Encounter for screening mammogram for malignant neoplasm of breast: Secondary | ICD-10-CM

## 2021-08-15 ENCOUNTER — Other Ambulatory Visit: Payer: Self-pay | Admitting: Family Medicine

## 2021-08-15 DIAGNOSIS — R928 Other abnormal and inconclusive findings on diagnostic imaging of breast: Secondary | ICD-10-CM

## 2021-08-21 ENCOUNTER — Other Ambulatory Visit: Payer: Self-pay | Admitting: Family Medicine

## 2021-08-21 ENCOUNTER — Ambulatory Visit
Admission: RE | Admit: 2021-08-21 | Discharge: 2021-08-21 | Disposition: A | Discharge status: Home / Self Care | Payer: Medicare Other | Source: Ambulatory Visit | Attending: Family Medicine | Admitting: Family Medicine

## 2021-08-21 DIAGNOSIS — R921 Mammographic calcification found on diagnostic imaging of breast: Secondary | ICD-10-CM

## 2021-08-21 DIAGNOSIS — R928 Other abnormal and inconclusive findings on diagnostic imaging of breast: Secondary | ICD-10-CM

## 2021-08-21 HISTORY — PX: BREAST BIOPSY: SHX20

## 2021-08-25 ENCOUNTER — Encounter: Payer: Self-pay | Admitting: *Deleted

## 2021-08-25 DIAGNOSIS — C50411 Malignant neoplasm of upper-outer quadrant of right female breast: Secondary | ICD-10-CM | POA: Insufficient documentation

## 2021-08-26 ENCOUNTER — Telehealth (HOSPITAL_BASED_OUTPATIENT_CLINIC_OR_DEPARTMENT_OTHER): Payer: Self-pay

## 2021-08-26 DIAGNOSIS — R21 Rash and other nonspecific skin eruption: Secondary | ICD-10-CM

## 2021-08-26 NOTE — Progress Notes (Signed)
Radiation Oncology         [phone] ________________________________  Multidisciplinary Breast Oncology Clinic [facility]) Initial Outpatient Consultation  Name: Judy Donovan MRN: [id number]  Date: 08/27/2021  DOB: 1950/12/02  CC:[place], Blondell Reveal, MD  Jovita Kussmaul, MD   REFERRING PHYSICIAN: Autumn Messing III, MD  DIAGNOSIS: There were no encounter diagnoses.  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  HISTORY OF PRESENT ILLNESS::Judy Donovan is a 71 y.o. female who is presenting to the office today for evaluation of her newly diagnosed breast cancer. She is accompanied by ***. She is doing well overall.   She had routine screening mammography on 08/13/21 showing a possible abnormality in the right breast. She underwent unilateral right diagnostic mammography with tomography [facility] on 08/21/21 showing: a indeterminate 6 mm group of right breast calcifications.  Biopsy of the upper outer right breast on 08/21/21 showed: grade 1-2 invasive ductal carcinoma measuring 0.3 cm in the greatest linear extent, with ductal carcinoma in-situ and calcifications. Prognostic indicators significant for: estrogen receptor, ***% {positive or negative} and progesterone receptor, ***% {positive or negative}, both with {include note beside percentage here/strong staining intensity}. Proliferation marker Ki67 at ***%. HER2 {positive or negative}.  Menarche: *** years old Age at first live birth: *** years old GP: *** LMP: *** Contraceptive: *** HRT: ***   The patient was referred today for presentation in the multidisciplinary conference.  Radiology studies and pathology slides were presented there for review and discussion of treatment options.  A consensus was discussed regarding potential next steps.  PREVIOUS RADIATION THERAPY: {EXAM; YES/NO:19492::"No"}  PAST MEDICAL HISTORY:  Past Medical  History:  Diagnosis Date   History of uterine fibroid    Internal and external hemorrhoids without complication    Osteoporosis    PONV (postoperative nausea and vomiting)     PAST SURGICAL HISTORY: Past Surgical History:  Procedure Laterality Date   CATARACT EXTRACTION W/ INTRAOCULAR LENS  IMPLANT, BILATERAL  2015   HEMORRHOID SURGERY N/A 11/19/2015   Procedure: HEMORRHOIDECTOMY;  Surgeon: Leighton Ruff, MD;  Location: [place];  Service: General;  Laterality: N/A;   TUBAL LIGATION Bilateral 1980's   VAGINAL HYSTERECTOMY  05/12/2004   w/  Bilateral Salpingoophorectomy    FAMILY HISTORY:  Family History  Problem Relation Age of Onset   Osteoporosis Mother    Hypertension Mother     SOCIAL HISTORY:  Social History   Socioeconomic History   Marital status: Married    Spouse name: Not on file   Number of children: Not on file   Years of education: Not on file   Highest education level: Not on file  Occupational History   Not on file  Tobacco Use   Smoking status: Former    Years: 5.00    Types: Cigarettes    Quit date: 11/14/1978    Years since quitting: 42.8   Smokeless tobacco: Never  Substance and Sexual Activity   Alcohol use: Yes    Alcohol/week: 1.0 standard drink of alcohol    Types: 1 Standard drinks or equivalent per week    Comment: occasional   Drug use: No   Sexual activity: Yes    Birth control/protection: Surgical  Other Topics Concern   Not on file  Social History Narrative   Not on file   Social Determinants of Health   Financial Resource Strain: Not on file  Food  Insecurity: Not on file  Transportation Needs: Not on file  Physical Activity: Not on file  Stress: Not on file  Social Connections: Not on file    ALLERGIES:  Allergies  Allergen Reactions   Amoxicillin Rash    MEDICATIONS:  Current Outpatient Medications  Medication Sig Dispense Refill   aspirin 81 MG tablet Take 81 mg by mouth daily.     cetirizine  (ZYRTEC) 5 MG tablet Take 5 mg by mouth as needed.      ketoconazole (NIZORAL) 2 % cream Apply 1 Application topically 2 (two) times daily. To affected areas. 60 g 1   Multiple Vitamin (MULTIVITAMIN) tablet Take 1 tablet by mouth daily.     Omega-3 Fatty Acids (FISH OIL PO) Take by mouth daily. 1 teaspoon in pm     No current facility-administered medications for this encounter.    REVIEW OF SYSTEMS: A 10+ POINT REVIEW OF SYSTEMS WAS OBTAINED including neurology, dermatology, psychiatry, cardiac, respiratory, lymph, extremities, GI, GU, musculoskeletal, constitutional, reproductive, HEENT. On the provided form, she reports ***. She denies *** and any other symptoms.    PHYSICAL EXAM:  vitals were not taken for this visit.  {may need to copy over vitals} Lungs are clear to auscultation bilaterally. Heart has regular rate and rhythm. No palpable cervical, supraclavicular, or axillary adenopathy. Abdomen soft, non-tender, normal bowel sounds. Breast: *** breast with no palpable mass, nipple discharge, or bleeding. *** breast with ***.   KPS = ***  100 - Normal; no complaints; no evidence of disease. 90   - Able to carry on normal activity; minor signs or symptoms of disease. 80   - Normal activity with effort; some signs or symptoms of disease. 19   - Cares for self; unable to carry on normal activity or to do active work. 60   - Requires occasional assistance, but is able to care for most of his personal needs. 50   - Requires considerable assistance and frequent medical care. 66   - Disabled; requires special care and assistance. 53   - Severely disabled; hospital admission is indicated although death not imminent. 35   - Very sick; hospital admission necessary; active supportive treatment necessary. 10   - Moribund; fatal processes progressing rapidly. 0     - Dead  Karnofsky DA, Abelmann Tracy, Craver LS and Burchenal [facility] [phone]) The use of the nitrogen mustards in the palliative treatment of  carcinoma: with particular reference to bronchogenic carcinoma Cancer 1 634-56  LABORATORY DATA:  Lab Results  Component Value Date   WBC 3.8 07/28/2021   HGB 13.7 07/28/2021   HCT 40.7 07/28/2021   MCV 90 07/28/2021   PLT 213 07/28/2021   Lab Results  Component Value Date   NA 141 07/28/2021   K 4.0 07/28/2021   CL 103 07/28/2021   CO2 24 07/28/2021   Lab Results  Component Value Date   ALT 23 07/28/2021   AST 25 07/28/2021   ALKPHOS 94 07/28/2021   BILITOT 0.3 07/28/2021    PULMONARY FUNCTION TEST:   Review Flowsheet        No data to display          RADIOGRAPHY: MM CLIP PLACEMENT RIGHT  Result Date: 08/21/2021 CLINICAL DATA:  Patient status post stereo biopsy right breast calcifications EXAM: 3D DIAGNOSTIC RIGHT MAMMOGRAM POST STEREOTACTIC BIOPSY COMPARISON:  Previous exam(s). FINDINGS: 3D Mammographic images were obtained following stereotactic guided biopsy of right breast calcifications. The biopsy marking clip is approximately  5 mm inferior to the site of biopsied calcifications. IMPRESSION: Approximate 5 mm inferior migration of the X shaped marking clip. Final Assessment: Post Procedure Mammograms for Marker Placement Electronically Signed   By: Lovey Newcomer M.D.   On: 08/21/2021 13:20  MM RT BREAST BX W LOC DEV 1ST LESION IMAGE BX SPEC STEREO GUIDE  Result Date: 08/21/2021 CLINICAL DATA:  Patient with indeterminate right breast calcifications. EXAM: RIGHT BREAST STEREOTACTIC CORE NEEDLE BIOPSY COMPARISON:  None Available. FINDINGS: The patient and I discussed the procedure of stereotactic-guided biopsy including benefits and alternatives. We discussed the high likelihood of a successful procedure. We discussed the risks of the procedure including infection, bleeding, tissue injury, clip migration, and inadequate sampling. Informed written consent was given. The usual time out protocol was performed immediately prior to the procedure. Using sterile technique and 1%  Lidocaine as local anesthetic, under stereotactic guidance, a 9 gauge vacuum assisted device was used to perform core needle biopsy of calcifications upper-outer right breast using a cranial approach. Specimen radiograph was performed showing calcifications. Specimens with calcifications are identified for pathology. Lesion quadrant: Upper outer quadrant At the conclusion of the procedure, X shaped tissue marker clip was deployed into the biopsy cavity. Follow-up 2-view mammogram was performed and dictated separately. IMPRESSION: Stereotactic-guided biopsy of right breast calcifications. No apparent complications. Electronically Signed   By: Lovey Newcomer M.D.   On: 08/21/2021 13:14  MM Digital Diagnostic Unilat R  Result Date: 08/21/2021 CLINICAL DATA:  71 year old female recalled from screening mammogram dated 08/13/2021 for right breast calcifications. EXAM: DIGITAL DIAGNOSTIC UNILATERAL RIGHT MAMMOGRAM TECHNIQUE: Right digital diagnostic mammography was performed. Mammographic images were processed with CAD. COMPARISON:  Previous exam(s). ACR Breast Density Category b: There are scattered areas of fibroglandular density. FINDINGS: There is a 6 mm group of fine linear branching calcifications in the upper outer right breast at mid depth. The have an indeterminate morphology. IMPRESSION: Indeterminate 6 mm group of right breast calcifications. RECOMMENDATION: Stereotactic biopsy of the right breast. I have discussed the findings and recommendations with the patient. If applicable, a reminder letter will be sent to the patient regarding the next appointment. BI-RADS CATEGORY  4: Suspicious. Electronically Signed   By: Kristopher Oppenheim M.D.   On: 08/21/2021 12:06  MM 3D SCREEN BREAST BILATERAL  Result Date: 08/14/2021 CLINICAL DATA:  Screening. EXAM: DIGITAL SCREENING BILATERAL MAMMOGRAM WITH TOMOSYNTHESIS AND CAD TECHNIQUE: Bilateral screening digital craniocaudal and mediolateral oblique mammograms were  obtained. Bilateral screening digital breast tomosynthesis was performed. The images were evaluated with computer-aided detection. COMPARISON:  Previous exams. ACR Breast Density Category b: There are scattered areas of fibroglandular density. FINDINGS: In the right breast, calcifications warrant further evaluation with magnified views. These calcifications are seen within the outer RIGHT breast. In the left breast, no findings suspicious for malignancy. IMPRESSION: Further evaluation is suggested for calcifications in the right breast. RECOMMENDATION: Diagnostic mammogram of the right breast. (Code:[id number]) The patient will be contacted regarding the findings, and additional imaging will be scheduled. BI-RADS CATEGORY  0: Incomplete. Need additional imaging evaluation and/or prior mammograms for comparison. Electronically Signed   By: Franki Cabot M.D.   On: 08/14/2021 09:41      IMPRESSION: ***   Patient will be a good candidate for breast conservation with radiotherapy to the right breast. We discussed the general course of radiation, potential side effects, and toxicities with radiation and the patient is interested in this approach. ***   PLAN:  ***    ------------------------------------------------  Blair Promise, PhD, MD  This document serves as a record of services personally performed by Gery Pray, MD. It was created on his behalf by Roney Mans, a trained medical scribe. The creation of this record is based on the scribe's personal observations and the provider's statements to them. This document has been checked and approved by the attending provider.

## 2021-08-27 ENCOUNTER — Ambulatory Visit
Admission: RE | Admit: 2021-08-27 | Discharge: 2021-08-27 | Disposition: A | Discharge status: Home / Self Care | Payer: Medicare Other | Source: Ambulatory Visit | Attending: Radiation Oncology | Admitting: Radiation Oncology

## 2021-08-27 ENCOUNTER — Telehealth: Payer: Self-pay | Admitting: Genetic Counselor

## 2021-08-27 ENCOUNTER — Other Ambulatory Visit: Payer: Self-pay | Admitting: *Deleted

## 2021-08-27 ENCOUNTER — Inpatient Hospital Stay: Payer: Medicare Other | Attending: Hematology and Oncology | Admitting: Hematology and Oncology

## 2021-08-27 ENCOUNTER — Inpatient Hospital Stay: Payer: Medicare Other

## 2021-08-27 ENCOUNTER — Encounter: Payer: Self-pay | Admitting: *Deleted

## 2021-08-27 ENCOUNTER — Ambulatory Visit: Payer: Self-pay | Admitting: General Surgery

## 2021-08-27 ENCOUNTER — Encounter: Payer: Self-pay | Admitting: General Practice

## 2021-08-27 ENCOUNTER — Other Ambulatory Visit: Payer: Self-pay

## 2021-08-27 DIAGNOSIS — C50411 Malignant neoplasm of upper-outer quadrant of right female breast: Secondary | ICD-10-CM

## 2021-08-27 DIAGNOSIS — Z801 Family history of malignant neoplasm of trachea, bronchus and lung: Secondary | ICD-10-CM | POA: Insufficient documentation

## 2021-08-27 DIAGNOSIS — Z87891 Personal history of nicotine dependence: Secondary | ICD-10-CM | POA: Diagnosis not present

## 2021-08-27 DIAGNOSIS — Z170 Estrogen receptor positive status [ER+]: Secondary | ICD-10-CM | POA: Diagnosis not present

## 2021-08-27 LAB — CBC WITH DIFFERENTIAL (CANCER CENTER ONLY)
Abs Immature Granulocytes: 0.01 10*3/uL (ref 0.00–0.07)
Basophils Absolute: 0.1 10*3/uL (ref 0.0–0.1)
Basophils Relative: 1 %
Eosinophils Absolute: 0.1 10*3/uL (ref 0.0–0.5)
Eosinophils Relative: 1 %
HCT: 41.1 % (ref 36.0–46.0)
Hemoglobin: 14.1 g/dL (ref 12.0–15.0)
Immature Granulocytes: 0 %
Lymphocytes Relative: 31 %
Lymphs Abs: 1.7 10*3/uL (ref 0.7–4.0)
MCH: 30.2 pg (ref 26.0–34.0)
MCHC: 34.3 g/dL (ref 30.0–36.0)
MCV: 88 fL (ref 80.0–100.0)
Monocytes Absolute: 0.5 10*3/uL (ref 0.1–1.0)
Monocytes Relative: 9 %
Neutro Abs: 3.2 10*3/uL (ref 1.7–7.7)
Neutrophils Relative %: 58 %
Platelet Count: 207 10*3/uL (ref 150–400)
RBC: 4.67 MIL/uL (ref 3.87–5.11)
RDW: 11.9 % (ref 11.5–15.5)
WBC Count: 5.6 10*3/uL (ref 4.0–10.5)
nRBC: 0 % (ref 0.0–0.2)

## 2021-08-27 LAB — CMP (CANCER CENTER ONLY)
ALT: 26 U/L (ref 0–44)
AST: 25 U/L (ref 15–41)
Albumin: 4.1 g/dL (ref 3.5–5.0)
Alkaline Phosphatase: 73 U/L (ref 38–126)
Anion gap: 7 (ref 5–15)
BUN: 10 mg/dL (ref 8–23)
CO2: 28 mmol/L (ref 22–32)
Calcium: 9.4 mg/dL (ref 8.9–10.3)
Chloride: 107 mmol/L (ref 98–111)
Creatinine: 0.92 mg/dL (ref 0.44–1.00)
GFR, Estimated: >60 mL/min (ref >60)
Glucose, Bld: 84 mg/dL (ref 70–99)
Potassium: 3.8 mmol/L (ref 3.5–5.1)
Sodium: 142 mmol/L (ref 135–145)
Total Bilirubin: 0.4 mg/dL (ref 0.3–1.2)
Total Protein: 7.6 g/dL (ref 6.5–8.1)

## 2021-08-27 LAB — GENETIC SCREENING ORDER

## 2021-08-27 NOTE — Research (Signed)
Trial:  [employer] 2021-05 - Specimen Collection Study to Evaluate Biomarkers in Subjects with Cancer   Patient Judy Donovan was identified by Dr. Lindi Adie as a potential candidate for the above listed study.  This Clinical Research Nurse met with Lendon Ka, [id number], on 08/27/21 in a manner and location that ensures patient privacy to discuss participation in the above listed research study.  Patient is Accompanied by her husband and daughter .  A copy of the informed consent document with embedded HIPAA language was provided to the patient.  Patient reads, speaks, and understands [place].   Patient was provided with the business card of this Nurse and encouraged to contact the research team with any questions.  Approximately 5 minutes were spent with the patient reviewing the informed consent documents.  Patient was provided the option of taking informed consent documents home to review and was encouraged to review at their convenience with their support network, including other care providers. Patient took the consent documents home to review.   Patient declined for research to call her to follow up. She states she will call research nurse if she wants to participate and understands it would need to happen prior to surgery. Thanked patient for her time and encouraged to call with any questions as well.  Foye Spurling, BSN, RN, [place] Nurse II 08/27/2021

## 2021-08-27 NOTE — Progress Notes (Signed)
[facility] CONSULT NOTE  Patient Care Team: [place], Blondell Reveal, MD as PCP - General (Family Medicine) Mauro Kaufmann, RN as Oncology Nurse Navigator Rockwell Germany, RN as Oncology Nurse Navigator  CHIEF COMPLAINTS/PURPOSE OF CONSULTATION:  Newly diagnosed breast cancer  HISTORY OF PRESENTING ILLNESS:  Judy Donovan 71 y.o. female is here because of recent diagnosis of right breast cancer  Screening mammogram on 08/13/2021 detected 71 year old female recalled from screening mammogram dated 08/13/2021 for right breast calcifications. 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   I reviewed her records extensively and collaborated the history with the patient.  SUMMARY OF ONCOLOGIC HISTORY: Oncology History  Malignant neoplasm of upper-outer quadrant of right breast in female, estrogen receptor positive ([place])  08/21/2021 Initial Diagnosis   Screening mammogram detected right breast calcifications 6 mm group UOQ stereotactic biopsy revealed grade 1-2 IDC with DCIS ER 100%, PR 0%, Ki-67 less than 5%, HER2 equivocal FISH pending      MEDICAL HISTORY:  Past Medical History:  Diagnosis Date   Cataracts, bilateral    History of uterine fibroid    Internal and external hemorrhoids without complication    Osteoporosis    PONV (postoperative nausea and vomiting)     SURGICAL HISTORY: Past Surgical History:  Procedure Laterality Date   CATARACT EXTRACTION W/ INTRAOCULAR LENS  IMPLANT, BILATERAL  2015   HEMORRHOID SURGERY N/A 11/19/2015   Procedure: HEMORRHOIDECTOMY;  Surgeon: Leighton Ruff, MD;  Location: [facility];  Service: General;  Laterality: N/A;    TUBAL LIGATION Bilateral 1980's   VAGINAL HYSTERECTOMY  05/12/2004   w/  Bilateral Salpingoophorectomy    SOCIAL HISTORY: Social History   Socioeconomic History   Marital status: Married    Spouse name: Not on file   Number of children: Not on file   Years of education: Not on file   Highest education level: Not on file  Occupational History   Not on file  Tobacco Use   Smoking status: Former    Years: 5.00    Types: Cigarettes    Quit date: 11/14/1978    Years since quitting: 42.8   Smokeless tobacco: Never  Substance and Sexual Activity   Alcohol use: Yes    Alcohol/week: 1.0 standard drink of alcohol    Types: 1 Standard drinks or equivalent per week    Comment: occasional   Drug use: No   Sexual activity: Yes    Birth control/protection: Surgical  Other Topics Concern   Not on file  Social History Narrative   Not on file   Social Determinants of Health   Financial Resource Strain: Not on file  Food Insecurity: Not on file  Transportation Needs: Not on file  Physical Activity: Not on file  Stress: Not on file  Social Connections: Not on file  Intimate Partner Violence: Not on file    FAMILY HISTORY: Family History  Problem Relation Age of Onset   Osteoporosis Mother    Hypertension Mother    Lung cancer Father 32    ALLERGIES:  is allergic to prolia [denosumab] and amoxicillin.  MEDICATIONS:  Current Outpatient Medications  Medication Sig Dispense Refill   Lansoprazole (PREVACID PO)  Take by mouth as needed.     MAGNESIUM PO Take 350 mg by mouth at bedtime.     OVER THE COUNTER MEDICATION Take 2 drops by mouth at bedtime. Pt takes sleep well medication     Probiotic Product (ALIGN PO) Take 1 capsule by mouth daily.     aspirin 81 MG tablet Take 81 mg by mouth daily.     cetirizine (ZYRTEC) 5 MG tablet Take 5 mg by mouth as needed.      ketoconazole (NIZORAL) 2 % cream Apply 1 Application topically 2 (two) times daily. To affected areas. 60 g 1    Multiple Vitamin (MULTIVITAMIN) tablet Take 1 tablet by mouth daily.     Omega-3 Fatty Acids (FISH OIL PO) Take by mouth daily. 1 teaspoon in pm     No current facility-administered medications for this visit.    REVIEW OF SYSTEMS:   Constitutional: Denies fevers, chills or abnormal night sweats   All other systems were reviewed with the patient and are negative.  PHYSICAL EXAMINATION: ECOG PERFORMANCE STATUS: 1 - Symptomatic but completely ambulatory  Vitals:   08/27/21 1256  BP: (!) 162/94  Pulse: (!) 105  Resp: 18  Temp: 97.9 F (36.6 C)  SpO2: 97%   Filed Weights   08/27/21 1256  Weight: 173 lb 8 oz (78.7 kg)    GENERAL:alert, no distress and comfortable    LABORATORY DATA:  I have reviewed the data as listed Lab Results  Component Value Date   WBC 5.6 08/27/2021   HGB 14.1 08/27/2021   HCT 41.1 08/27/2021   MCV 88.0 08/27/2021   PLT 207 08/27/2021   Lab Results  Component Value Date   NA 142 08/27/2021   K 3.8 08/27/2021   CL 107 08/27/2021   CO2 28 08/27/2021    RADIOGRAPHIC STUDIES: I have personally reviewed the radiological reports and agreed with the findings in the report.  ASSESSMENT AND PLAN:  Malignant neoplasm of upper-outer quadrant of right breast in female, estrogen receptor positive ([place]) 08/21/2021:Screening mammogram detected right breast calcifications 6 mm group UOQ stereotactic biopsy revealed grade 1-2 IDC with DCIS ER 100%, PR 0%, Ki-67 less than 5%, HER2 equivocal FISH pending  Pathology and radiology counseling: Discussed with the patient, the details of pathology including the type of breast cancer,the clinical staging, the significance of ER, PR and HER-2/neu receptors and the implications for treatment. After reviewing the pathology in detail, we proceeded to discuss the different treatment options between surgery, radiation, chemotherapy, antiestrogen therapies.  Treatment plan: 1.  Breast conserving surgery with sentinel lymph  node biopsy 2. adjuvant radiation 3.  Adjuvant antiestrogen therapy  Return to clinic after surgery to discuss final pathology report   All questions were answered. The patient knows to call the clinic with any problems, questions or concerns.    Harriette Ohara, MD 08/27/21  I Gardiner Coins am scribing for Dr. Lindi Adie  I have reviewed the above documentation for accuracy and completeness, and I agree with the above.

## 2021-08-27 NOTE — Assessment & Plan Note (Signed)
08/21/2021:Screening mammogram detected right breast calcifications 6 mm group UOQ stereotactic biopsy revealed grade 1-2 IDC with DCIS ER 100%, PR 0%, Ki-67 less than 5%, HER2 equivocal FISH pending  Pathology and radiology counseling: Discussed with the patient, the details of pathology including the type of breast cancer,the clinical staging, the significance of ER, PR and HER-2/neu receptors and the implications for treatment. After reviewing the pathology in detail, we proceeded to discuss the different treatment options between surgery, radiation, chemotherapy, antiestrogen therapies.  Treatment plan: 1.  Breast conserving surgery with sentinel lymph node biopsy 2. adjuvant radiation 3.  Adjuvant antiestrogen therapy  Return to clinic after surgery to discuss final pathology report 

## 2021-08-27 NOTE — Addendum Note (Signed)
Addended by: Nicholas Lose on: 08/27/2021 02:59 PM   Modules accepted: Level of Service

## 2021-08-27 NOTE — Telephone Encounter (Signed)
Judy Donovan was seen by a genetic counselor during the breast multidisciplinary clinic on 08/27/2021. In addition to her personal history of breast cancer, she reported her father was diagnosed with lung cancer at age 72. She does not meet NCCN criteria for genetic testing at this time. She was still offered genetic counseling and testing but declined. We encourage her to contact us if there are any changes to her personal or family history of cancer. If she meets NCCN criteria based on the updated personal/family history, she would be recommended to have genetic counseling and testing.   Lucille Passy, MS, [facility] Genetic Counselor [email or web address] (P) [phone]

## 2021-08-28 ENCOUNTER — Other Ambulatory Visit (HOSPITAL_BASED_OUTPATIENT_CLINIC_OR_DEPARTMENT_OTHER): Payer: Self-pay | Admitting: Family Medicine

## 2021-08-28 MED ORDER — FLUOCINOLONE ACETONIDE 0.01 % EX SHAM: 1.0000 "application " | MEDICATED_SHAMPOO | Freq: Every day | CUTANEOUS | 1 refills | Status: DC

## 2021-08-28 NOTE — Progress Notes (Signed)
[facility] Psychosocial Distress Screening Spiritual Care  Met with Judy Donovan, husband Judy Donovan, and daughter Judy Donovan in Breast Multidisciplinary Clinic to introduce Support Center team/resources, reviewing distress screen per protocol.  The patient scored a 7 on the Psychosocial Distress Thermometer which indicates severe distress. Also assessed for distress and other psychosocial needs.    08/28/2021  [facility] DISTRESS SCREENING   Screening Type Initial Screening   Distress experienced in past week (1-10) 7 !   Emotional problem type Nervousness/Anxiety;Adjusting to illness   Physical Problem type Skin dry/itchy   Referral to support programs Yes    [name] reports that attending [facility] helped reduce her distress to a 2. She is feeling much relieved! She is also a talented painter (oils) and may have an interest in [facility] healing arts support programming.  Provided pastoral presence, empathic listening, emotional support, normalization of feelings, and introduction to [facility] support programming.   Follow up needed: No. [name] is aware of ongoing chaplain availability and knows to reach out as needed/desired.   Chaplain Lisa Lundeen, MDiv, [facility] Pager [phone] Voicemail [phone]       

## 2021-09-01 ENCOUNTER — Telehealth (HOSPITAL_BASED_OUTPATIENT_CLINIC_OR_DEPARTMENT_OTHER): Payer: Self-pay

## 2021-09-01 NOTE — Telephone Encounter (Signed)
Pt called and stated she is waiting for paperwork to be faxed over from [place] for her husband Judy Donovan. Because he is having surgery on 10/06/2021.

## 2021-09-02 ENCOUNTER — Telehealth: Payer: Self-pay | Admitting: *Deleted

## 2021-09-02 ENCOUNTER — Encounter: Payer: Self-pay | Admitting: *Deleted

## 2021-09-02 NOTE — Telephone Encounter (Signed)
Spoke with patient to follow up from [facility] and assess navigation needs.  Patient was inquiring about when her sx date will be. Informed her I would send a message to the surgery scheduler at Dr. Ethlyn Gallery office to give her an update. Patient verbalized understanding. Patient denies any further questions or concerns. Encouraged her to call should anything arise.

## 2021-09-03 ENCOUNTER — Encounter: Payer: Self-pay | Admitting: *Deleted

## 2021-09-03 ENCOUNTER — Other Ambulatory Visit: Payer: Self-pay | Admitting: *Deleted

## 2021-09-03 ENCOUNTER — Other Ambulatory Visit: Payer: Self-pay | Admitting: General Surgery

## 2021-09-03 DIAGNOSIS — C50411 Malignant neoplasm of upper-outer quadrant of right female breast: Secondary | ICD-10-CM

## 2021-09-04 ENCOUNTER — Ambulatory Visit (INDEPENDENT_AMBULATORY_CARE_PROVIDER_SITE_OTHER): Payer: Medicare Other | Admitting: Family Medicine

## 2021-09-04 ENCOUNTER — Telehealth: Payer: Self-pay | Admitting: Hematology and Oncology

## 2021-09-04 ENCOUNTER — Encounter (HOSPITAL_BASED_OUTPATIENT_CLINIC_OR_DEPARTMENT_OTHER): Payer: Self-pay | Admitting: Family Medicine

## 2021-09-04 DIAGNOSIS — G479 Sleep disorder, unspecified: Secondary | ICD-10-CM | POA: Diagnosis not present

## 2021-09-04 MED ORDER — LORAZEPAM 0.5 MG PO TABS: 0.5000 mg | ORAL_TABLET | Freq: Two times a day (BID) | ORAL | 1 refills | Status: DC | PRN

## 2021-09-04 NOTE — Telephone Encounter (Signed)
.  Called patient to schedule appointment per 7/19 inbasket, patient is aware of date and time.   

## 2021-09-04 NOTE — Assessment & Plan Note (Signed)
Patient reports having issues with increased stress/anxiety and trouble with sleeping.  Symptoms began with recent diagnosis of breast cancer.  Since that time, she has noticed that she has been a bit more on edge and has had trouble relaxing.  It is also affected her sleep, primarily indicates that she will have some nighttime awakenings.  On further discussion, it it seems that she has also had some self-medicating with alcohol to assist with calming her nerves in the evening/helping out with sleep.  She is scheduled for surgery upcoming early next month.  Her husband is also scheduled for surgery in the near future for joint replacement.  Long discussion with patient regarding recent testing, diagnosis, stressors, treatment options both related to underlying stress and anxiety and related to sleep concerns. After discussion, patient elected proceed with benzodiazepine initially and monitor response.  Did discuss in depth potential side effects related to medication to be aware of. Discussed possibility of arranging appointment for psychotherapy/CBT.  She will consider this, not wanting referral at this time Discussed possibility of scheduling follow-up, she would prefer to let us know how things are progressing with medication as she begins taking it

## 2021-09-04 NOTE — Progress Notes (Signed)
   Virtual Visit via Telephone   I connected with  Judy Donovan  on 09/04/21 by telephone/telehealth and verified that I am speaking with the correct person using two identifiers.   I discussed the limitations, risks, security and privacy concerns of performing an evaluation and management service by telephone, including the higher likelihood of inaccurate diagnosis and treatment, and the availability of in person appointments.  We also discussed the likely need of an additional face to face encounter for complete and high quality delivery of care.  I also discussed with the patient that there may be a patient responsible charge related to this service. The patient expressed understanding and wishes to proceed.  Provider location is in medical facility. Patient location is at their home, different from provider location. People involved in care of the patient during this telehealth encounter were myself, my nurse/medical assistant, and my front office/scheduling team member.  Review of Systems: No fevers, chills, night sweats, weight loss, chest pain, or shortness of breath.   Objective Findings:    General: Speaking full sentences, no audible heavy breathing.  Sounds alert and appropriately interactive.    Independent interpretation of tests performed by another provider:   None.  Brief History, Exam, Impression, and Recommendations:    Sleep disturbance Patient reports having issues with increased stress/anxiety and trouble with sleeping.  Symptoms began with recent diagnosis of breast cancer.  Since that time, she has noticed that she has been a bit more on edge and has had trouble relaxing.  It is also affected her sleep, primarily indicates that she will have some nighttime awakenings.  On further discussion, it it seems that she has also had some self-medicating with alcohol to assist with calming her nerves in the evening/helping out with sleep.  She is scheduled for surgery  upcoming early next month.  Her husband is also scheduled for surgery in the near future for joint replacement.  Long discussion with patient regarding recent testing, diagnosis, stressors, treatment options both related to underlying stress and anxiety and related to sleep concerns. After discussion, patient elected proceed with benzodiazepine initially and monitor response.  Did discuss in depth potential side effects related to medication to be aware of. Discussed possibility of arranging appointment for psychotherapy/CBT.  She will consider this, not wanting referral at this time Discussed possibility of scheduling follow-up, she would prefer to let us know how things are progressing with medication as she begins taking it  I discussed the above assessment and treatment plan with the patient. The patient was provided an opportunity to ask questions and all were answered. The patient agreed with the plan and demonstrated an understanding of the instructions.  The patient was advised to call back or seek an in-person evaluation if the symptoms worsen or if the condition fails to improve as anticipated.  I provided 30 minutes of face to face and non-face-to-face time during this encounter date, time was needed to gather information, review chart, records, communicate/coordinate with staff remotely, as well as complete documentation.   ___________________________________________ Haji Delaine de [place], MD, ABFM, CAQSM [facility] and [place]

## 2021-09-05 ENCOUNTER — Other Ambulatory Visit: Payer: Self-pay

## 2021-09-05 ENCOUNTER — Encounter: Payer: Self-pay | Admitting: *Deleted

## 2021-09-05 ENCOUNTER — Ambulatory Visit: Payer: Medicare Other | Attending: Hematology and Oncology | Admitting: Physical Therapy

## 2021-09-05 DIAGNOSIS — R293 Abnormal posture: Secondary | ICD-10-CM | POA: Insufficient documentation

## 2021-09-05 DIAGNOSIS — C50411 Malignant neoplasm of upper-outer quadrant of right female breast: Secondary | ICD-10-CM | POA: Insufficient documentation

## 2021-09-05 DIAGNOSIS — Z170 Estrogen receptor positive status [ER+]: Secondary | ICD-10-CM | POA: Diagnosis not present

## 2021-09-05 NOTE — Patient Instructions (Signed)

## 2021-09-05 NOTE — Therapy (Addendum)
OUTPATIENT PHYSICAL THERAPY BREAST CANCER BASELINE EVALUATION   Patient Name: Judy Donovan MRN: [id number] DOB:October 27, 1950, 71 y.o., female Today's Date: 09/05/2021   PT End of Session - 09/05/21 0914     Visit Number 1    Number of Visits 2    Date for PT Re-Evaluation 10/06/21    PT Start Time 0800    PT Stop Time 0900    PT Time Calculation (min) 60 min    Activity Tolerance Patient tolerated treatment well    Behavior During Therapy WFL for tasks assessed/performed             Past Medical History:  Diagnosis Date   Cataracts, bilateral    History of uterine fibroid    Internal and external hemorrhoids without complication    Osteoporosis    PONV (postoperative nausea and vomiting)    Past Surgical History:  Procedure Laterality Date   CATARACT EXTRACTION W/ INTRAOCULAR LENS  IMPLANT, BILATERAL  2015   HEMORRHOID SURGERY N/A 11/19/2015   Procedure: HEMORRHOIDECTOMY;  Surgeon: Leighton Ruff, MD;  Location: [facility];  Service: General;  Laterality: N/A;   TUBAL LIGATION Bilateral 1980's   VAGINAL HYSTERECTOMY  05/12/2004   w/  Bilateral Salpingoophorectomy   Patient Active Problem List   Diagnosis Date Noted   Sleep disturbance 09/04/2021   Malignant neoplasm of upper-outer quadrant of right breast in female, estrogen receptor positive ([place]) 08/25/2021   Wellness examination 08/06/2021   Hyperlipidemia 06/10/2021   Osteoporosis 11/01/2015   Cataracts, bilateral 11/01/2015    PCP: Debbra Riding   REFERRING PROVIDER: Nicholas Lose, MD   REFERRING DIAG: C50.411,Z17.0 (ICD-10-CM) - Malignant neoplasm of upper-outer quadrant of right breast in female, estrogen receptor positive ([place])   THERAPY DIAG:  Abnormal posture Right breast cancer   Rationale for Evaluation and Treatment Rehabilitation  ONSET DATE:  08/13/2021  SUBJECTIVE                                                                                                                                                                                            SUBJECTIVE STATEMENT: Patient reports she is here today to be seen by her medical team for her newly diagnosed right breast cancer.   Pt states she doesn't really know why she is here and this diagnosis has been "a lot"  Her husband is having total knee surgery on August 21 and she is having her breast surgery on Ausgust 11 She does not know if she will have to have chemo or radiation   PERTINENT HISTORY:    Screening mammogram on 08/13/2021 detected 71 year old female recalled from  screening mammogram dated 08/13/2021 for right breast calcifications. 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   PAIN:  Are you having pain? No   PRECAUTIONS: Active CA   HAND DOMINANCE: right  WEIGHT BEARING RESTRICTIONS No  FALLS:  Has patient fallen in last 6 months? No  LIVING ENVIRONMENT: Patient lives with: husband  son and daughter and their families live nearby  Lives in: House/apartment Has following equipment at home: None  OCCUPATION: retired   LEISURE: loves to paint. Loves to walk when she has time.   PRIOR LEVEL OF FUNCTION: Independent   OBJECTIVE  COGNITION:  Overall cognitive status: Within functional limits for tasks assessed    POSTURE:  Forward head and rounded forward shoulders posture   Increased thoracic kyphosis   UPPER EXTREMITY AROM/PROM:  A/PROM RIGHT   eval   Shoulder extension 45  Shoulder flexion 135  Shoulder abduction 168  Shoulder internal rotation 30  Shoulder external rotation 75    (Blank rows = not tested)  A/PROM LEFT   eval  Shoulder extension 50  Shoulder flexion 142  Shoulder abduction 170  Shoulder internal rotation 40   Shoulder external rotation 90    (Blank rows = not tested)   CERVICAL AROM: All within normal limits:      UPPER EXTREMITY STRENGTH: WFL   LYMPHEDEMA ASSESSMENTS:   Pt has area of firm "swelling" in  right proximal forearm that she has had for several years. It is sometimes painful. It is laterally moveable with no pain   LANDMARK RIGHT   eval  10 cm proximal to olecranon process 30.5  Olecranon process 25.5  10 cm proximal to ulnar styloid process 21.5  Just proximal to ulnar styloid process 16  Across hand at thumb web space 19  At base of 2nd digit 6.5  (Blank rows = not tested)  LANDMARK LEFT   eval  10 cm proximal to olecranon process 28.5  Olecranon process 24.5  10 cm proximal to ulnar styloid process 18.5  Just proximal to ulnar styloid process 15  Across hand at thumb web space 18.5  At base of 2nd digit 6.3  (Blank rows = not tested)    L-DEX LYMPHEDEMA SCREENING:  The patient was assessed using the L-Dex machine today to produce a lymphedema index baseline score. The patient will be reassessed on a regular basis (typically every 3 months) to obtain new L-Dex scores. If the score is > 6.5 points away from his/her baseline score indicating onset of subclinical lymphedema, it will be recommended to wear a compression garment for 4 weeks, 12 hours per day and then be reassessed. If the score continues to be > 6.5 points from baseline at reassessment, we will initiate lymphedema treatment. Assessing in this manner has a 95% rate of preventing clinically significant lymphedema.   L-DEX FLOWSHEETS - 09/05/21 0900       L-DEX LYMPHEDEMA SCREENING   Measurement Type Unilateral    L-DEX MEASUREMENT EXTREMITY Upper Extremity    POSITION  Standing    DOMINANT SIDE Right    At Risk Side Right    BASELINE SCORE (UNILATERAL) 3.4              QUICK DASH SURVEY:  2.27  PATIENT EDUCATION:  Education details: Lymphedema risk reduction and post op  shoulder/posture HEP Person educated: Patient Education method: Explanation, Demonstration, Handout Education comprehension: Patient verbalized understanding and returned demonstration   HOME EXERCISE PROGRAM: Patient was instructed today in a home exercise program today for post op shoulder range of motion. These included active assist shoulder flexion in sitting, scapular retraction, wall walking with shoulder abduction, and hands behind head external rotation.  She was encouraged to do these twice a day, holding 3 seconds and repeating 5 times when permitted by her physician. Also instructed in supine dowel flexion in supine  with head supported to begin not to increase shoulder flexion    ASSESSMENT:  CLINICAL IMPRESSION: Judy Donovan is planning to have right lumpectomy on 09/26/2021  She has thoracicy kyphosis and shoulder stiffness so was started on stretching exercises preop to continue one week post op. She was given information about getting a compression bra and knows to do walking and gentle neck and postural exericses for first week post op before she starts shoulder exercises.  Note that she has an enlarged area on right proximal  forearm for a long time so we will be aware of that in post op lymphedema assessments She will benefit from a post op PT reassessment to determine needs and from L-Dex screens every 3 months for 2 years to detect subclinical lymphedema.  Pt will benefit from skilled therapeutic intervention to improve on the following deficits: Decreased knowledge of precautions, impaired UE functional use, pain, decreased ROM, postural dysfunction.   PT treatment/interventions: ADL/self-care home management, pt/family education, therapeutic exercise  REHAB POTENTIAL: Good  CLINICAL DECISION MAKING: Stable/uncomplicated  EVALUATION COMPLEXITY: Low   GOALS: Goals reviewed with patient? YES  LONG TERM GOALS: (STG=LTG)    Name Target Date Goal status  1 Pt will be  able to verbalize understanding of pertinent lymphedema risk reduction practices relevant to her dx specifically related to skin care.  Baseline:  No knowledge 09/05/2021 Achieved at eval  2 Pt will be able to return demo and/or verbalize understanding of the post op HEP related to regaining shoulder ROM. Baseline:  No knowledge 09/05/2021 Achieved at eval  3 Pt will be able to verbalize understanding of the importance of attending the post op After Breast CA Class for further lymphedema risk reduction education and therapeutic exercise.  Baseline:  No knowledge 09/05/2021 Achieved at eval  4 Pt will demo she has regained full shoulder ROM and function post operatively compared to baselines.  Baseline: See objective measurements taken today. 11/06/2021 New      PLAN: PT FREQUENCY/DURATION: EVAL and 1 follow up appointment.   PLAN FOR NEXT SESSION: will reassess 3-4 weeks post op to determine needs.   Patient will follow up at outpatient cancer rehab 3-4 weeks following surgery.  If the patient requires physical therapy at that time, a specific plan will be dictated and sent to the referring physician for approval. The patient was educated today on appropriate basic range of motion exercises to begin post operatively and the importance of attending the After Breast Cancer class following surgery.  Patient was educated today on lymphedema risk reduction practices as it pertains to recommendations that will benefit the patient immediately following surgery.  She verbalized good understanding.    Physical Therapy Information for After Breast Cancer Surgery/Treatment:  Lymphedema is a swelling condition that you may be at risk for in your arm if you have lymph nodes removed from the armpit area.  After a sentinel node biopsy, the risk is approximately 5-9% and is higher after an axillary node dissection.  There is treatment available for this condition and it is not life-threatening.  Contact your  physician or physical therapist with concerns. You may begin the 4 shoulder/posture exercises (see additional sheet) when permitted by your physician (typically a week after surgery).  If you have drains, you may need to wait until those are removed before beginning range of motion exercises.  A general recommendation is to not lift your arms above shoulder height until drains are removed.  These exercises should be done to your tolerance and gently.  This is not a "no pain/no gain" type of recovery so listen to your body and stretch into the range of motion that you can tolerate, stopping if you have pain.  If you are having immediate reconstruction, ask your plastic surgeon about doing exercises as he or she may want you to wait. We encourage you to attend the free one time ABC (After Breast Cancer) class offered by [facility].  You will learn information related to lymphedema risk, prevention and treatment and additional exercises to regain mobility following surgery.  You can call [phone] for more information.  This is offered the 1st and 3rd Monday of each month.  You only attend the class one time. While undergoing any medical procedure or treatment, try to avoid blood pressure being taken or needle sticks from occurring on the arm on the side of cancer.   This recommendation begins after surgery and continues for the rest of your life.  This may help reduce your risk of getting lymphedema (swelling in your arm). An excellent resource for those seeking information on lymphedema is the National Lymphedema Network's web site. It can be accessed at [facility].org If you notice swelling in your hand, arm or breast at any time following surgery (even if it is many years from now), please contact your doctor or physical therapist to discuss this.  Lymphedema can be treated at any time but it is easier for you if it is treated early on.  If you feel like your shoulder motion is  not returning to normal in a reasonable amount of time, please contact your surgeon or physical therapist.  [facility] [phone]. [address], [address], [place] [address]  ABC CLASS After Breast Cancer Class  After Breast Cancer Class is a specially designed exercise class to assist you in a safe recover after having breast cancer surgery.  In this class you will learn how to get back to full function whether your drains were just removed or if you had surgery a month ago.  This one-time class is held the 1st and 3rd Monday of every month from 11:00 a.m. until 12:00 noon virtually.  This class is FREE and space is limited. For more information or to register for the next available class, call [phone].  Class Goals  Understand specific stretches to improve the flexibility of you chest and shoulder. Learn ways to safely strengthen your upper body and improve your posture. Understand the warning signs of infection and why you may be at risk for an arm infection. Learn about Lymphedema and prevention.  ** You do not attend this class until after surgery.  Drains must be removed to participate  Patient was instructed today in a home exercise program today for post op shoulder range of motion. These included active assist shoulder  flexion in sitting, scapular retraction, wall walking with shoulder abduction, and hands behind head external rotation.  She was encouraged to do these twice a day, holding 3 seconds and repeating 5 times when permitted by her physician.   Donato Heinz. Owens Shark PT  Judy Donovan, PT 09/05/2021, 9:15 AM

## 2021-09-05 NOTE — Addendum Note (Signed)
Addended by: Kipp Laurence on: 09/05/2021 09:26 AM   Modules accepted: Orders

## 2021-09-08 ENCOUNTER — Telehealth (HOSPITAL_BASED_OUTPATIENT_CLINIC_OR_DEPARTMENT_OTHER): Payer: Self-pay

## 2021-09-08 MED ORDER — LORAZEPAM 1 MG PO TABS: 0.5000 mg | ORAL_TABLET | Freq: Two times a day (BID) | ORAL | 0 refills | Status: DC | PRN

## 2021-09-19 ENCOUNTER — Encounter (HOSPITAL_BASED_OUTPATIENT_CLINIC_OR_DEPARTMENT_OTHER): Payer: Self-pay | Admitting: General Surgery

## 2021-09-19 ENCOUNTER — Other Ambulatory Visit: Payer: Self-pay

## 2021-09-19 MED ORDER — CHLORHEXIDINE GLUCONATE CLOTH 2 % EX PADS: 6.0000 | MEDICATED_PAD | Freq: Once | CUTANEOUS | Status: DC

## 2021-09-19 NOTE — Progress Notes (Signed)

## 2021-09-25 ENCOUNTER — Ambulatory Visit
Admission: RE | Admit: 2021-09-25 | Discharge: 2021-09-25 | Disposition: A | Discharge status: Home / Self Care | Payer: Medicare Other | Source: Ambulatory Visit | Attending: General Surgery | Admitting: General Surgery

## 2021-09-25 DIAGNOSIS — Z170 Estrogen receptor positive status [ER+]: Secondary | ICD-10-CM

## 2021-09-26 ENCOUNTER — Encounter (HOSPITAL_BASED_OUTPATIENT_CLINIC_OR_DEPARTMENT_OTHER)
Admission: RE | Disposition: A | Discharge status: Home / Self Care | Payer: Self-pay | Source: Home / Self Care | Attending: General Surgery

## 2021-09-26 ENCOUNTER — Ambulatory Visit (HOSPITAL_BASED_OUTPATIENT_CLINIC_OR_DEPARTMENT_OTHER)
Admission: RE | Admit: 2021-09-26 | Discharge: 2021-09-26 | Disposition: A | Discharge status: Home / Self Care | Payer: Medicare Other | Attending: General Surgery | Admitting: General Surgery

## 2021-09-26 ENCOUNTER — Encounter (HOSPITAL_BASED_OUTPATIENT_CLINIC_OR_DEPARTMENT_OTHER): Payer: Self-pay | Admitting: General Surgery

## 2021-09-26 ENCOUNTER — Ambulatory Visit (HOSPITAL_BASED_OUTPATIENT_CLINIC_OR_DEPARTMENT_OTHER): Payer: Medicare Other | Admitting: Anesthesiology

## 2021-09-26 ENCOUNTER — Ambulatory Visit
Admission: RE | Admit: 2021-09-26 | Discharge: 2021-09-26 | Disposition: A | Discharge status: Home / Self Care | Payer: Medicare Other | Source: Ambulatory Visit | Attending: General Surgery | Admitting: General Surgery

## 2021-09-26 ENCOUNTER — Other Ambulatory Visit: Payer: Self-pay

## 2021-09-26 DIAGNOSIS — N6011 Diffuse cystic mastopathy of right breast: Secondary | ICD-10-CM | POA: Diagnosis not present

## 2021-09-26 DIAGNOSIS — Z01818 Encounter for other preprocedural examination: Secondary | ICD-10-CM

## 2021-09-26 DIAGNOSIS — C50911 Malignant neoplasm of unspecified site of right female breast: Secondary | ICD-10-CM

## 2021-09-26 DIAGNOSIS — Z87891 Personal history of nicotine dependence: Secondary | ICD-10-CM | POA: Insufficient documentation

## 2021-09-26 DIAGNOSIS — Z170 Estrogen receptor positive status [ER+]: Secondary | ICD-10-CM

## 2021-09-26 DIAGNOSIS — C50411 Malignant neoplasm of upper-outer quadrant of right female breast: Secondary | ICD-10-CM

## 2021-09-26 DIAGNOSIS — K219 Gastro-esophageal reflux disease without esophagitis: Secondary | ICD-10-CM | POA: Diagnosis not present

## 2021-09-26 HISTORY — PX: BREAST LUMPECTOMY: SHX2

## 2021-09-26 HISTORY — PX: BREAST LUMPECTOMY WITH RADIOACTIVE SEED AND SENTINEL LYMPH NODE BIOPSY: SHX6550

## 2021-09-26 HISTORY — DX: Gastro-esophageal reflux disease without esophagitis: K21.9

## 2021-09-26 SURGERY — BREAST LUMPECTOMY WITH RADIOACTIVE SEED AND SENTINEL LYMPH NODE BIOPSY
Anesthesia: General | Site: Breast | Laterality: Right

## 2021-09-26 MED ORDER — GABAPENTIN 300 MG PO CAPS
ORAL_CAPSULE | ORAL | Status: AC
Start: 2021-09-26 — End: ?
  Filled 2021-09-26: qty 1

## 2021-09-26 MED ORDER — MIDAZOLAM HCL 2 MG/2ML IJ SOLN
INTRAMUSCULAR | Status: AC
  Filled 2021-09-26: qty 2

## 2021-09-26 MED ORDER — ONDANSETRON HCL 4 MG/2ML IJ SOLN
INTRAMUSCULAR | Status: DC | PRN
  Administered 2021-09-26: 4 mg via INTRAVENOUS

## 2021-09-26 MED ORDER — ACETAMINOPHEN 500 MG PO TABS
1000.0000 mg | ORAL_TABLET | ORAL | Status: AC
  Administered 2021-09-26: 1000 mg via ORAL

## 2021-09-26 MED ORDER — FENTANYL CITRATE (PF) 100 MCG/2ML IJ SOLN
INTRAMUSCULAR | Status: DC | PRN
Start: 2021-09-26 — End: 2021-09-26
  Administered 2021-09-26: 50 ug via INTRAVENOUS
  Administered 2021-09-26 (×2): 25 ug via INTRAVENOUS

## 2021-09-26 MED ORDER — FENTANYL CITRATE (PF) 100 MCG/2ML IJ SOLN
50.0000 ug | Freq: Once | INTRAMUSCULAR | Status: AC
  Administered 2021-09-26: 50 ug via INTRAVENOUS

## 2021-09-26 MED ORDER — BUPIVACAINE-EPINEPHRINE (PF) 0.25% -1:200000 IJ SOLN
INTRAMUSCULAR | Status: AC
  Filled 2021-09-26: qty 120

## 2021-09-26 MED ORDER — FENTANYL CITRATE (PF) 100 MCG/2ML IJ SOLN
INTRAMUSCULAR | Status: AC
  Filled 2021-09-26: qty 2

## 2021-09-26 MED ORDER — VANCOMYCIN HCL IN DEXTROSE 1-5 GM/200ML-% IV SOLN
INTRAVENOUS | Status: AC
Start: 2021-09-26 — End: ?
  Filled 2021-09-26: qty 200

## 2021-09-26 MED ORDER — CELECOXIB 200 MG PO CAPS
200.0000 mg | ORAL_CAPSULE | ORAL | Status: AC
  Administered 2021-09-26: 200 mg via ORAL

## 2021-09-26 MED ORDER — VANCOMYCIN HCL IN DEXTROSE 1-5 GM/200ML-% IV SOLN
1000.0000 mg | INTRAVENOUS | Status: AC
  Administered 2021-09-26: 1000 mg via INTRAVENOUS

## 2021-09-26 MED ORDER — PHENYLEPHRINE HCL (PRESSORS) 10 MG/ML IV SOLN
INTRAVENOUS | Status: DC | PRN
  Administered 2021-09-26 (×10): 80 ug via INTRAVENOUS

## 2021-09-26 MED ORDER — TRAMADOL HCL 50 MG PO TABS
50.0000 mg | ORAL_TABLET | Freq: Once | ORAL | Status: AC
  Administered 2021-09-26: 50 mg via ORAL

## 2021-09-26 MED ORDER — DEXAMETHASONE SODIUM PHOSPHATE 4 MG/ML IJ SOLN
INTRAMUSCULAR | Status: DC | PRN
  Administered 2021-09-26: 5 mg

## 2021-09-26 MED ORDER — OXYCODONE HCL 5 MG PO TABS: 5.0000 mg | ORAL_TABLET | Freq: Once | ORAL | Status: DC

## 2021-09-26 MED ORDER — MAGTRACE LYMPHATIC TRACER
INTRAMUSCULAR | Status: DC | PRN
  Administered 2021-09-26: 2 mL via INTRAMUSCULAR

## 2021-09-26 MED ORDER — CLONIDINE HCL (ANALGESIA) 100 MCG/ML EP SOLN
EPIDURAL | Status: DC | PRN
  Administered 2021-09-26: 80 ug

## 2021-09-26 MED ORDER — MIDAZOLAM HCL 2 MG/2ML IJ SOLN
1.0000 mg | Freq: Once | INTRAMUSCULAR | Status: AC
  Administered 2021-09-26: 1 mg via INTRAVENOUS

## 2021-09-26 MED ORDER — LACTATED RINGERS IV SOLN: INTRAVENOUS | Status: DC

## 2021-09-26 MED ORDER — LIDOCAINE 2% (20 MG/ML) 5 ML SYRINGE
INTRAMUSCULAR | Status: AC
Start: 2021-09-26 — End: ?
  Filled 2021-09-26: qty 5

## 2021-09-26 MED ORDER — 0.9 % SODIUM CHLORIDE (POUR BTL) OPTIME
TOPICAL | Status: DC | PRN
  Administered 2021-09-26: 200 mL

## 2021-09-26 MED ORDER — OXYCODONE HCL 5 MG PO TABS: 5.0000 mg | ORAL_TABLET | Freq: Four times a day (QID) | ORAL | 0 refills | Status: DC | PRN

## 2021-09-26 MED ORDER — PROPOFOL 10 MG/ML IV BOLUS
INTRAVENOUS | Status: DC | PRN
  Administered 2021-09-26: 120 mg via INTRAVENOUS

## 2021-09-26 MED ORDER — BUPIVACAINE-EPINEPHRINE 0.25% -1:200000 IJ SOLN
INTRAMUSCULAR | Status: DC | PRN
  Administered 2021-09-26: 17 mL

## 2021-09-26 MED ORDER — LIDOCAINE HCL (CARDIAC) PF 100 MG/5ML IV SOSY
PREFILLED_SYRINGE | INTRAVENOUS | Status: DC | PRN
  Administered 2021-09-26: 80 mg via INTRAVENOUS

## 2021-09-26 MED ORDER — GABAPENTIN 300 MG PO CAPS
300.0000 mg | ORAL_CAPSULE | ORAL | Status: AC
  Administered 2021-09-26: 300 mg via ORAL

## 2021-09-26 MED ORDER — OXYCODONE HCL 5 MG PO TABS
ORAL_TABLET | ORAL | Status: AC
  Filled 2021-09-26: qty 1

## 2021-09-26 MED ORDER — DEXAMETHASONE SODIUM PHOSPHATE 10 MG/ML IJ SOLN
INTRAMUSCULAR | Status: AC
  Filled 2021-09-26: qty 1

## 2021-09-26 MED ORDER — TRAMADOL HCL 50 MG PO TABS
ORAL_TABLET | ORAL | Status: AC
  Filled 2021-09-26: qty 1

## 2021-09-26 MED ORDER — ONDANSETRON HCL 4 MG/2ML IJ SOLN
INTRAMUSCULAR | Status: AC
Start: 2021-09-26 — End: ?
  Filled 2021-09-26: qty 2

## 2021-09-26 MED ORDER — ACETAMINOPHEN 500 MG PO TABS
ORAL_TABLET | ORAL | Status: AC
  Filled 2021-09-26: qty 2

## 2021-09-26 MED ORDER — CELECOXIB 200 MG PO CAPS
ORAL_CAPSULE | ORAL | Status: AC
  Filled 2021-09-26: qty 1

## 2021-09-26 MED ORDER — DEXAMETHASONE SODIUM PHOSPHATE 10 MG/ML IJ SOLN
INTRAMUSCULAR | Status: DC | PRN
  Administered 2021-09-26: 5 mg via INTRAVENOUS

## 2021-09-26 MED ORDER — ROPIVACAINE HCL 5 MG/ML IJ SOLN
INTRAMUSCULAR | Status: DC | PRN
  Administered 2021-09-26: 30 mL

## 2021-09-26 MED ORDER — PROPOFOL 10 MG/ML IV BOLUS
INTRAVENOUS | Status: AC
Start: 2021-09-26 — End: ?
  Filled 2021-09-26: qty 20

## 2021-09-26 SURGICAL SUPPLY — 48 items
ADH SKN CLS APL DERMABOND .7 (GAUZE/BANDAGES/DRESSINGS) ×1
APL PRP STRL LF DISP 70% ISPRP (MISCELLANEOUS) ×1
APPLIER CLIP 9.375 MED OPEN (MISCELLANEOUS) ×2
APR CLP MED 9.3 20 MLT OPN (MISCELLANEOUS) ×1
BLADE SURG 15 STRL LF DISP TIS (BLADE) ×2 IMPLANT
BLADE SURG 15 STRL SS (BLADE) ×2
CANISTER SUC SOCK COL 7IN (MISCELLANEOUS) IMPLANT
CANISTER SUCT 1200ML W/VALVE (MISCELLANEOUS) ×1 IMPLANT
CHLORAPREP W/TINT 26 (MISCELLANEOUS) ×3 IMPLANT
CLIP APPLIE 9.375 MED OPEN (MISCELLANEOUS) ×2 IMPLANT
COVER BACK TABLE 60X90IN (DRAPES) ×3 IMPLANT
COVER MAYO STAND STRL (DRAPES) ×3 IMPLANT
COVER PROBE W GEL 5X96 (DRAPES) ×4 IMPLANT
DERMABOND ADVANCED (GAUZE/BANDAGES/DRESSINGS) ×1
DERMABOND ADVANCED .7 DNX12 (GAUZE/BANDAGES/DRESSINGS) ×2 IMPLANT
DRAPE LAPAROSCOPIC ABDOMINAL (DRAPES) ×3 IMPLANT
DRAPE UTILITY XL STRL (DRAPES) ×3 IMPLANT
ELECT COATED BLADE 2.86 ST (ELECTRODE) ×3 IMPLANT
ELECT REM PT RETURN 9FT ADLT (ELECTROSURGICAL) ×2
ELECTRODE REM PT RTRN 9FT ADLT (ELECTROSURGICAL) ×2 IMPLANT
GLOVE BIO SURGEON STRL SZ7.5 (GLOVE) ×3 IMPLANT
GLOVE BIOGEL PI IND STRL 7.0 (GLOVE) IMPLANT
GLOVE BIOGEL PI INDICATOR 7.0 (GLOVE) ×1
GLOVE SURG SS PI 6.5 STRL IVOR (GLOVE) ×1 IMPLANT
GOWN STRL REUS W/ TWL LRG LVL3 (GOWN DISPOSABLE) ×4 IMPLANT
GOWN STRL REUS W/TWL LRG LVL3 (GOWN DISPOSABLE) ×4
ILLUMINATOR WAVEGUIDE N/F (MISCELLANEOUS) IMPLANT
KIT MARKER MARGIN INK (KITS) ×3 IMPLANT
LIGHT WAVEGUIDE WIDE FLAT (MISCELLANEOUS) IMPLANT
NDL HYPO 25X1 1.5 SAFETY (NEEDLE) ×2 IMPLANT
NDL SAFETY ECLIPSE 18X1.5 (NEEDLE) IMPLANT
NEEDLE HYPO 18GX1.5 SHARP (NEEDLE)
NEEDLE HYPO 25X1 1.5 SAFETY (NEEDLE) ×2 IMPLANT
NS IRRIG 1000ML POUR BTL (IV SOLUTION) ×1 IMPLANT
PACK BASIN DAY SURGERY FS (CUSTOM PROCEDURE TRAY) ×3 IMPLANT
PENCIL SMOKE EVACUATOR (MISCELLANEOUS) ×3 IMPLANT
SLEEVE SCD COMPRESS KNEE MED (STOCKING) ×3 IMPLANT
SPIKE FLUID TRANSFER (MISCELLANEOUS) IMPLANT
SPONGE T-LAP 18X18 ~~LOC~~+RFID (SPONGE) ×3 IMPLANT
SUT MON AB 4-0 PC3 18 (SUTURE) ×6 IMPLANT
SUT SILK 2 0 SH (SUTURE) IMPLANT
SUT VICRYL 3-0 CR8 SH (SUTURE) ×3 IMPLANT
SYR CONTROL 10ML LL (SYRINGE) ×3 IMPLANT
TOWEL GREEN STERILE FF (TOWEL DISPOSABLE) ×3 IMPLANT
TRACER MAGTRACE VIAL (MISCELLANEOUS) ×1 IMPLANT
TRAY FAXITRON CT DISP (TRAY / TRAY PROCEDURE) ×3 IMPLANT
TUBE CONNECTING 20X1/4 (TUBING) ×1 IMPLANT
YANKAUER SUCT BULB TIP NO VENT (SUCTIONS) ×1 IMPLANT

## 2021-09-26 NOTE — Progress Notes (Signed)
Assisted Dr. Lissa Hoard with right, pectoralis, ultrasound guided block. Side rails up, monitors on throughout procedure. See vital signs in flow sheet. Tolerated Procedure well.

## 2021-09-26 NOTE — Anesthesia Procedure Notes (Signed)
Procedure Name: LMA Insertion Date/Time: 09/26/2021 8:24 AM  Performed by: Bufford Spikes, CRNAPre-anesthesia Checklist: Patient identified, Emergency Drugs available, Suction available and Patient being monitored Patient Re-evaluated:Patient Re-evaluated prior to induction Oxygen Delivery Method: Circle system utilized Preoxygenation: Pre-oxygenation with 100% oxygen Induction Type: IV induction Ventilation: Mask ventilation without difficulty LMA: LMA inserted LMA Size: 4.0 Number of attempts: 1 Airway Equipment and Method: Bite block Placement Confirmation: positive ETCO2 Tube secured with: Tape Dental Injury: Teeth and Oropharynx as per pre-operative assessment

## 2021-09-26 NOTE — Anesthesia Procedure Notes (Signed)
Anesthesia Regional Block: Pectoralis block   Pre-Anesthetic Checklist: , timeout performed,  Correct Patient, Correct Site, Correct Laterality,  Correct Procedure, Correct Position, site marked,  Risks and benefits discussed,  Surgical consent,  Pre-op evaluation,  At surgeon's request and post-op pain management  Laterality: Right  Prep: chloraprep       Needles:   Needle Type: Stimiplex     Needle Length: 9cm      Additional Needles:   Procedures:,,,, ultrasound used (permanent image in chart),,    Narrative:  Start time: 09/26/2021 7:35 AM End time: 09/26/2021 7:55 AM Injection made incrementally with aspirations every 5 mL.  Performed by: Personally  Anesthesiologist: Nolon Nations, MD  Additional Notes: BP cuff, SpO2 and EKG monitors applied. Sedation begun.  Anesthetic injected incrementally, slowly, and after neg aspirations under direct ultrasound guidance. Good fascial spread noted. Patient tolerated well.

## 2021-09-26 NOTE — Interval H&P Note (Signed)
History and Physical Interval Note:  09/26/2021 7:50 AM  Judy Donovan  has presented today for surgery, with the diagnosis of RIGHT BREAST CANCER.  The various methods of treatment have been discussed with the patient and family. After consideration of risks, benefits and other options for treatment, the patient has consented to  Procedure(s): RIGHT BREAST RADIOACTIVE SEED LOCALIZED LUMPECTOMY AND SENTINEL NODE BIOPSY (Right) as a surgical intervention.  The patient's history has been reviewed, patient examined, no change in status, stable for surgery.  I have reviewed the patient's chart and labs.  Questions were answered to the patient's satisfaction.     Autumn Messing III

## 2021-09-26 NOTE — Anesthesia Preprocedure Evaluation (Signed)
Anesthesia Evaluation  Patient identified by MRN, date of birth, ID band Patient awake    Reviewed: Allergy & Precautions, NPO status , Patient's Chart, lab work & pertinent test results  History of Anesthesia Complications (+) PONV and history of anesthetic complications  Airway Mallampati: II  TM Distance: >3 FB Neck ROM: Full    Dental  (+) Teeth Intact, Dental Advisory Given,    Pulmonary former smoker,    breath sounds clear to auscultation       Cardiovascular negative cardio ROS   Rhythm:Regular Rate:Normal     Neuro/Psych negative neurological ROS  negative psych ROS   GI/Hepatic Neg liver ROS, GERD  ,  Endo/Other  negative endocrine ROS  Renal/GU negative Renal ROS     Musculoskeletal negative musculoskeletal ROS (+)   Abdominal   Peds  Hematology negative hematology ROS (+)   Anesthesia Other Findings   Reproductive/Obstetrics negative OB ROS                             Lab Results  Component Value Date   WBC 5.6 08/27/2021   HGB 14.1 08/27/2021   HCT 41.1 08/27/2021   MCV 88.0 08/27/2021   PLT 207 08/27/2021   Lab Results  Component Value Date   CREATININE 0.92 08/27/2021   BUN 10 08/27/2021   NA 142 08/27/2021   K 3.8 08/27/2021   CL 107 08/27/2021   CO2 28 08/27/2021   No results found for: "INR", "PROTIME"  11/2015 EKG: normal sinus rhythm.  Anesthesia Physical  Anesthesia Plan  ASA: 2  Anesthesia Plan: General   Post-op Pain Management: Tylenol PO (pre-op)*, Celebrex PO (pre-op)*, Gabapentin PO (pre-op)* and Regional block*   Induction: Intravenous  PONV Risk Score and Plan: 4 or greater and Ondansetron, Dexamethasone, Treatment may vary due to age or medical condition and Midazolam  Airway Management Planned: LMA  Additional Equipment:   Intra-op Plan:   Post-operative Plan: Extubation in OR  Informed Consent: I have reviewed the  patients History and Physical, chart, labs and discussed the procedure including the risks, benefits and alternatives for the proposed anesthesia with the patient or authorized representative who has indicated his/her understanding and acceptance.       Plan Discussed with: CRNA  Anesthesia Plan Comments:         Anesthesia Quick Evaluation

## 2021-09-26 NOTE — Discharge Instructions (Addendum)
May take Tylenol after 1pm, if needed. May take NSAIDS (ibuprofen, motrin) after 4:30pm, if needed.    Post Anesthesia Home Care Instructions  Activity: Get plenty of rest for the remainder of the day. A responsible individual must stay with you for 24 hours following the procedure.  For the next 24 hours, DO NOT: -Drive a car -Paediatric nurse -Drink alcoholic beverages -Take any medication unless instructed by your physician -Make any legal decisions or sign important papers.  Meals: Start with liquid foods such as gelatin or soup. Progress to regular foods as tolerated. Avoid greasy, spicy, heavy foods. If nausea and/or vomiting occur, drink only clear liquids until the nausea and/or vomiting subsides. Call your physician if vomiting continues.  Special Instructions/Symptoms: Your throat may feel dry or sore from the anesthesia or the breathing tube placed in your throat during surgery. If this causes discomfort, gargle with warm salt water. The discomfort should disappear within 24 hours.  If you had a scopolamine patch placed behind your ear for the management of post- operative nausea and/or vomiting:  1. The medication in the patch is effective for 72 hours, after which it should be removed.  Wrap patch in a tissue and discard in the trash. Wash hands thoroughly with soap and water. 2. You may remove the patch earlier than 72 hours if you experience unpleasant side effects which may include dry mouth, dizziness or visual disturbances. 3. Avoid touching the patch. Wash your hands with soap and water after contact with the patch.      Regional Anesthesia Blocks  1. Numbness or the inability to move the "blocked" extremity may last from 3-48 hours after placement. The length of time depends on the medication injected and your individual response to the medication. If the numbness is not going away after 48 hours, call your surgeon.  2. The extremity that is blocked will need to  be protected until the numbness is gone and the  Strength has returned. Because you cannot feel it, you will need to take extra care to avoid injury. Because it may be weak, you may have difficulty moving it or using it. You may not know what position it is in without looking at it while the block is in effect.  3. For blocks in the legs and feet, returning to weight bearing and walking needs to be done carefully. You will need to wait until the numbness is entirely gone and the strength has returned. You should be able to move your leg and foot normally before you try and bear weight or walk. You will need someone to be with you when you first try to ensure you do not fall and possibly risk injury.  4. Bruising and tenderness at the needle site are common side effects and will resolve in a few days.  5. Persistent numbness or new problems with movement should be communicated to the surgeon or the [facility] [phone] [place] [phone]).

## 2021-09-26 NOTE — Anesthesia Postprocedure Evaluation (Signed)
Anesthesia Post Note  Patient: Judy Donovan  Procedure(s) Performed: RIGHT BREAST RADIOACTIVE SEED LOCALIZED LUMPECTOMY AND SENTINEL NODE BIOPSY (Right: Breast)     Patient location during evaluation: PACU Anesthesia Type: General Level of consciousness: sedated and patient cooperative Pain management: pain level controlled Vital Signs Assessment: post-procedure vital signs reviewed and stable Respiratory status: spontaneous breathing Cardiovascular status: stable Anesthetic complications: no   No notable events documented.  Last Vitals:  Vitals:   09/26/21 1015 09/26/21 1049  BP: (!) 142/71 134/75  Pulse: 85 79  Resp: 15 16  Temp:  36.4 C  SpO2: 94% 96%    Last Pain:  Vitals:   09/26/21 1115  TempSrc:   PainSc: 2                  Nolon Nations

## 2021-09-26 NOTE — H&P (Signed)
REFERRING PHYSICIAN: Rulon Eisenmenger, MD  PROVIDER: Landry Corporal, MD  MRN: [id number] DOB: 09/04/1950 Subjective   Chief Complaint: Breast Cancer   History of Present Illness: Judy Donovan is a 71 y.o. female who is seen today as an office consultation for evaluation of Breast Cancer .   We are asked to see the patient in consultation by Dr. Lindi Adie to evaluate her for a new right breast cancer. The patient is a 71 year old white female who recently went for a routine screening mammogram. At that time she was found to have 6 mm of calcification in the upper outer quadrant of the right breast that was abnormal. 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 She is otherwise in pretty good health and does not smoke. She has no family history of breast cancer.  Review of Systems: A complete review of systems was obtained from the patient. I have reviewed this information and discussed as appropriate with the patient. See HPI as well for other ROS.  ROS   Medical History: Past Medical History:  Diagnosis Date  Anxiety  Arthritis   Patient Active Problem List  Diagnosis  Malignant neoplasm of upper-outer quadrant of right breast in female, estrogen receptor positive (CMS-HCC)   Past Surgical History:  Procedure Laterality Date  CATARACT EXTRACTION  HEMORRHOID SURGERY  HYSTERECTOMY  LAPAROSCOPIC TUBAL LIGATION    Allergies  Allergen Reactions  Prolia [Denosumab] Dizziness  Amoxicillin Rash   Current Outpatient Medications on File Prior to Visit  Medication Sig Dispense Refill  aspirin 81 MG EC tablet Take 81 mg by mouth once daily  cetirizine (ZYRTEC) 5 MG tablet Take 5 mg by mouth  ketoconazole (NIZORAL) 2 % cream Apply topically  MAGNESIUM ORAL Take by mouth  multivitamin tablet Take 1 tablet by mouth once daily  omega-3 fatty acids-fish oil  300-1,000 mg capsule Take by mouth   No current facility-administered medications on file prior to visit.   Family History  Problem Relation Age of Onset  Stroke Mother  High blood pressure (Hypertension) Mother  Hyperlipidemia (Elevated cholesterol) Mother  Deep vein thrombosis (DVT or abnormal blood clot formation) Father  Myocardial Infarction (Heart attack) Paternal Grandmother  Myocardial Infarction (Heart attack) Paternal Grandfather    Social History   Tobacco Use  Smoking Status Never  Smokeless Tobacco Never    Social History   Socioeconomic History  Marital status: Married  Tobacco Use  Smoking status: Never  Smokeless tobacco: Never  Substance and Sexual Activity  Alcohol use: Yes  Drug use: Never   Objective:  There were no vitals filed for this visit.  There is no height or weight on file to calculate BMI.  Physical Exam Vitals reviewed.  Constitutional:  General: She is not in acute distress. Appearance: Normal appearance.  HENT:  Head: Normocephalic and atraumatic.  Right Ear: External ear normal.  Left Ear: External ear normal.  Nose: Nose normal.  Mouth/Throat:  Mouth: Mucous membranes are moist.  Pharynx: Oropharynx is clear.  Eyes:  General: No scleral icterus. Extraocular Movements: Extraocular movements intact.  Conjunctiva/sclera: Conjunctivae normal.  Pupils: Pupils are equal, round, and reactive to light.  Cardiovascular:  Rate and Rhythm: Normal rate and regular rhythm.  Pulses: Normal pulses.  Heart sounds: Normal heart sounds.  Pulmonary:  Effort: Pulmonary effort is normal. No respiratory distress.  Breath sounds: Normal breath sounds.  Abdominal:  General: Bowel sounds are normal.  Palpations: Abdomen is soft.  Tenderness: There is no abdominal tenderness.  Musculoskeletal:  General: No swelling, tenderness or deformity. Normal range of motion.  Cervical back: Normal range of motion and neck supple.  Skin: General: Skin  is warm and dry.  Coloration: Skin is not jaundiced.  Neurological:  General: No focal deficit present.  Mental Status: She is alert and oriented to person, place, and time.  Psychiatric:  Mood and Affect: Mood normal.  Behavior: Behavior normal.    Breast: There is no palpable mass in either breast. There is no palpable axillary, supraclavicular, or cervical lymphadenopathy.  Labs, Imaging and Diagnostic Testing:  Assessment and Plan:   Diagnoses and all orders for this visit:  Malignant neoplasm of upper-outer quadrant of right breast in female, estrogen receptor positive (CMS-HCC)    The patient appears to have a 6 mm cancer in the upper outer quadrant of the right breast with clinically negative nodes. I have discussed with her in detail the different options for treatment and at this point she favors breast conservation which I feel is very reasonable. She is also a good candidate for sentinel node biopsy as well. I have discussed with her in detail the risks and benefits of the operation as well as some of the technical aspects including use of a radioactive seed for localization and she understands and wishes to proceed. She will meet with medical and radiation oncology to discuss adjuvant therapy. We will move ahead with surgical planning.

## 2021-09-26 NOTE — Transfer of Care (Signed)
Immediate Anesthesia Transfer of Care Note  Patient: Judy Donovan  Procedure(s) Performed: RIGHT BREAST RADIOACTIVE SEED LOCALIZED LUMPECTOMY AND SENTINEL NODE BIOPSY (Right: Breast)  Patient Location: PACU  Anesthesia Type:General and Regional  Level of Consciousness: awake, alert  and oriented  Airway & Oxygen Therapy: Patient Spontanous Breathing and Patient connected to nasal cannula oxygen  Post-op Assessment: Report given to RN and Post -op Vital signs reviewed and stable  Post vital signs: Reviewed and stable  Last Vitals:  Vitals Value Taken Time  BP 147/85 09/26/21 0948  Temp    Pulse 97 09/26/21 0949  Resp 15 09/26/21 0949  SpO2 96 % 09/26/21 0949  Vitals shown include unvalidated device data.  Last Pain:  Vitals:   09/26/21 0658  TempSrc: Oral  PainSc: 0-No pain      Patients Stated Pain Goal: 7 (82/42/35 3614)  Complications: No notable events documented.

## 2021-09-26 NOTE — Op Note (Signed)
09/26/2021  9:38 AM  PATIENT:  Judy Donovan  71 y.o. female  PRE-OPERATIVE DIAGNOSIS:  RIGHT BREAST CANCER  POST-OPERATIVE DIAGNOSIS:  Right Breast Cancer  PROCEDURE:  Procedure(s): RIGHT BREAST RADIOACTIVE SEED LOCALIZED LUMPECTOMY AND DEEP RIGHT AXILLARY SENTINEL NODE BIOPSY (Right)  SURGEON:  Surgeon(s) and Role:    * Jovita Kussmaul, MD - Primary  PHYSICIAN ASSISTANT:   ASSISTANTS: none   ANESTHESIA:   local and general  EBL:  25 mL   BLOOD ADMINISTERED:none  DRAINS: none   LOCAL MEDICATIONS USED:  MARCAINE     SPECIMEN:  Source of Specimen:  right breast tissue with additional lateral margin and sentinel node  DISPOSITION OF SPECIMEN:  PATHOLOGY  COUNTS:  YES  TOURNIQUET:  * No tourniquets in log *  DICTATION: .Dragon Dictation  After informed consent was obtained the patient was brought to the operating room and placed in the supine position on the operating table.  After adequate induction of general anesthesia the patient's right chest, breast, and axillary area were prepped with ChloraPrep, allowed to dry, and draped in usual sterile manner.  An appropriate timeout was performed.  Previously an I-125 seed was placed in the upper outer central right breast to mark an area of invasive breast cancer.  At this point, 2 cc of iron oxide were injected into the subareolar plexus of the right breast and the breast was massaged for about 5 minutes.  The neoprobe was then set to I-125 in the area of radioactivity in the right breast was readily identified.  The area around this was infiltrated with quarter percent Marcaine.  A curvilinear incision was made along the upper outer edge of the areola of the right breast with a 15 blade knife.  The incision was carried through the skin and subcutaneous tissue sharply with the electrocautery.  Dissection was then carried between the breast tissue and the subcutaneous fat and skin in the upper outer quadrant until the dissection was  beyond the area of the cancer.  I then removed a circular portion of breast tissue sharply with the electrocautery around the radioactive seed while checking the area of radioactivity frequently.  Once the specimen was removed it was oriented with the appropriate paint colors.  A specimen radiograph was obtained that showed the clip and seed to be near the center of the specimen.  I did elect to take an additional lateral margin and this was also marked appropriately.  All of the tissue was sent to pathology for further evaluation.  Hemostasis was achieved using the Bovie electrocautery.  The wound was irrigated with saline and infiltrated with more quarter percent Marcaine.  The deep layer of the incision was then closed with layers of interrupted 3-0 Vicryl stitches.  The skin was then closed with interrupted 4-0 Monocryl subcuticular stitches.  Attention was then turned to the right axilla.  The mag trace was used to identify a signal in the right axilla.  The area overlying this was infiltrated with quarter percent Marcaine.  A small transversely oriented incision was made with a 15 blade knife overlying the signal.  The incision was carried through the skin and subcutaneous tissue sharply with the electrocautery until the deep right axillary space was entered.  The mag trace was used to identify a hot lymph node.  This was excised sharply with the electrocautery and the surrounding small vessels and lymphatics were controlled with clips.  This was sent as sentinel node #1.  No other hot  or palpable nodes were identified in the right axilla.  Hemostasis was achieved using the Bovie electrocautery.  The deep layer of the incision was then closed with interrupted 3-0 Vicryl stitches.  The skin was then closed with a running 4-0 Monocryl subcuticular stitch.  Dermabond dressings were applied.  The patient tolerated the procedure well.  At the end of the case all needle sponge and instrument counts were correct.   The patient was then awakened and taken to recovery in stable condition.  PLAN OF CARE: Discharge to home after PACU  PATIENT DISPOSITION:  PACU - hemodynamically stable.   Delay start of Pharmacological VTE agent (>24hrs) due to surgical blood loss or risk of bleeding: not applicable

## 2021-09-29 ENCOUNTER — Encounter (HOSPITAL_BASED_OUTPATIENT_CLINIC_OR_DEPARTMENT_OTHER): Payer: Self-pay | Admitting: General Surgery

## 2021-09-29 LAB — SURGICAL PATHOLOGY

## 2021-10-02 ENCOUNTER — Encounter: Payer: Self-pay | Admitting: *Deleted

## 2021-10-13 ENCOUNTER — Inpatient Hospital Stay: Payer: Medicare Other | Admitting: Hematology and Oncology

## 2021-10-14 ENCOUNTER — Other Ambulatory Visit: Payer: Self-pay

## 2021-10-14 ENCOUNTER — Inpatient Hospital Stay: Payer: Medicare Other | Attending: Hematology and Oncology | Admitting: Hematology and Oncology

## 2021-10-14 VITALS — BP 147/85 | HR 86 | Temp 97.9°F | Resp 18 | Ht 65.0 in | Wt 170.6 lb

## 2021-10-14 DIAGNOSIS — Z780 Asymptomatic menopausal state: Secondary | ICD-10-CM | POA: Diagnosis not present

## 2021-10-14 DIAGNOSIS — C50411 Malignant neoplasm of upper-outer quadrant of right female breast: Secondary | ICD-10-CM | POA: Diagnosis present

## 2021-10-14 DIAGNOSIS — Z79811 Long term (current) use of aromatase inhibitors: Secondary | ICD-10-CM | POA: Insufficient documentation

## 2021-10-14 DIAGNOSIS — Z170 Estrogen receptor positive status [ER+]: Secondary | ICD-10-CM | POA: Insufficient documentation

## 2021-10-14 MED ORDER — LETROZOLE 2.5 MG PO TABS: 2.5000 mg | ORAL_TABLET | Freq: Every day | ORAL | 3 refills | Status: DC

## 2021-10-14 NOTE — Progress Notes (Signed)
Location of Breast Cancer:upper-outer quadrant of right breast   Histology per Pathology Report:   09/26/2021 FINAL MICROSCOPIC DIAGNOSIS:   A. BREAST, RIGHT, LUMPECTOMY:  - Benign breast parenchyma with previous biopsy-associated changes  - No evidence of residual carcinoma  - Fibrocystic changes  - See oncology table  - See comment   B. BREAST, RIGHT LATERAL MARGIN, EXCISION:  - Benign breast parenchyma with no specific histopathologic changes  - Negative for carcinoma   C. LYMPH NODE, RIGHT AXILLA #1, SENTINEL, EXCISION:  - Lymph node, negative for carcinoma (0/1)   D. LYMPH NODE, RIGHT AXILLA, SENTINEL, EXCISION:  - Lymph node, negative for carcinoma (0/1)   Receptor Status: ER(100%), PR (0%), Her2-neu (negative), Ki-(<5%)  Did patient present with symptoms (if so, please note symptoms) or was this found on screening mammography?: screening mammogram  Past/Anticipated interventions by surgeon, if EVO:JJKKX BREAST RADIOACTIVE SEED LOCALIZED LUMPECTOMY AND DEEP RIGHT AXILLARY SENTINEL NODE BIOPSY (Right) 09/26/2021 by Dr. Marlou Starks   Past/Anticipated interventions by medical oncology, if any: adjuvant antiestrogen therapy following radiation.  Lymphedema issues, if any:  {:18581} {t:21944}   Pain issues, if any:  {:18581} {PAIN DESCRIPTION:21022940}  SAFETY ISSUES: Prior radiation? {:18581} Pacemaker/ICD? {:18581} Possible current pregnancy?no Is the patient on methotrexate? {:18581}  Current Complaints / other details:  ***    Jacqulyn Liner, RN 10/14/2021,8:32 AM

## 2021-10-14 NOTE — Progress Notes (Signed)
Radiation Oncology         [phone] ________________________________  Name: Judy Donovan MRN: [id number]  Date: 10/15/2021  DOB: 1951-01-28  Re-Evaluation Note  CC: [place], Blondell Reveal, MD  Nicholas Lose, MD  No diagnosis found.  Diagnosis:   S/p right breast lumpectomy: no evidence of residual invasive or in-situ carcinoma   Stage IA (cT1b, cN0, cM0) Right Breast UOQ, Invasive and in-situ ductal carcinoma, ER+ / PR- / Her2 -, Grade 1-2   Cancer Staging  Malignant neoplasm of upper-outer quadrant of right breast in female, estrogen receptor positive ([place]) Staging form: Breast, AJCC 8th Edition - Clinical stage from 08/27/2021: Stage IA (cT1b, cN0, cM0, G2, ER+, PR-, HER2-) - Unsigned  Narrative:  The patient returns today to discuss radiation treatment options. She was seen in the multidisciplinary breast clinic on 09/07/21.   She opted to proceed with right breast lumpectomy with nodal biopsies on 09/26/21 under the care of Dr. Marlou Starks. Pathology from the procedure revealed: no evidence of residual carcinoma with fibrocystic changes. Nodal status of 2/2 right axillary sentinel lymph nodes excisions negative for carcinoma.   The patient will return to Dr. Lindi Adie following XRT to discuss antiestrogen treatment options.   On review of systems, the patient reports ***. She denies *** and any other symptoms.   Allergies:  is allergic to prolia [denosumab] and amoxicillin.  Meds: Current Outpatient Medications  Medication Sig Dispense Refill   aspirin 81 MG tablet Take 81 mg by mouth daily.     cetirizine (ZYRTEC) 5 MG tablet Take 5 mg by mouth as needed.      ketoconazole (NIZORAL) 2 % cream Apply 1 Application topically 2 (two) times daily. To affected areas. 60 g 1   Lansoprazole (PREVACID PO) Take by mouth as needed.     letrozole (FEMARA) 2.5 MG tablet Take 1 tablet (2.5 mg total) by mouth daily. 90 tablet 3   LORazepam (ATIVAN) 1 MG tablet Take 0.5 tablets (0.5 mg total)  by mouth 2 (two) times daily as needed for anxiety. 15 tablet 0   MAGNESIUM PO Take 350 mg by mouth at bedtime.     Omega-3 Fatty Acids (FISH OIL PO) Take by mouth daily. 1 teaspoon in pm     OVER THE COUNTER MEDICATION Take 2 drops by mouth at bedtime. Pt takes sleep well medication     oxyCODONE (ROXICODONE) 5 MG immediate release tablet Take 1 tablet (5 mg total) by mouth every 6 (six) hours as needed for severe pain. 10 tablet 0   Probiotic Product (ALIGN PO) Take 1 capsule by mouth daily.     No current facility-administered medications for this encounter.    Physical Findings: The patient is in no acute distress. Patient is alert and oriented.  vitals were not taken for this visit.  No significant changes. Lungs are clear to auscultation bilaterally. Heart has regular rate and rhythm. No palpable cervical, supraclavicular, or axillary adenopathy. Abdomen soft, non-tender, normal bowel sounds. Left Breast: no palpable mass, nipple discharge or bleeding. Right Breast: ***  Lab Findings: Lab Results  Component Value Date   WBC 5.6 08/27/2021   HGB 14.1 08/27/2021   HCT 41.1 08/27/2021   MCV 88.0 08/27/2021   PLT 207 08/27/2021    Radiographic Findings: MM Breast Surgical Specimen  Result Date: 09/26/2021 CLINICAL DATA:  Specimen radiograph status post right breast lumpectomy. EXAM: SPECIMEN RADIOGRAPH OF THE RIGHT BREAST COMPARISON:  Previous exam(s). FINDINGS: Status post excision of the right  breast. The radioactive seed and biopsy marker clip are present, completely intact, and were marked for pathology. These findings were communicated to the OR at 8:48 a.m. IMPRESSION: Specimen radiograph of the right breast. Electronically Signed   By: Ammie Ferrier M.D.   On: 09/26/2021 08:59  MM RT RADIOACTIVE SEED LOC MAMMO GUIDE  Result Date: 09/25/2021 CLINICAL DATA:  Radioactive seed localization prior to right breast lumpectomy. EXAM: MAMMOGRAPHIC GUIDED RADIOACTIVE SEED  LOCALIZATION OF THE RIGHT BREAST COMPARISON:  Previous exam(s). FINDINGS: Patient presents for radioactive seed localization prior to right breast lumpectomy. I met with the patient and we discussed the procedure of seed localization including benefits and alternatives. We discussed the high likelihood of a successful procedure. We discussed the risks of the procedure including infection, bleeding, tissue injury and further surgery. We discussed the low dose of radioactivity involved in the procedure. Informed, written consent was given. The usual time-out protocol was performed immediately prior to the procedure. Using mammographic guidance, sterile technique, 1% lidocaine and an I-125 radioactive seed, the biopsy marking clip in the upper-outer right breast was localized using a superior approach. The follow-up mammogram images confirm the seed in the expected location and were marked for Dr. Marlou Starks. Follow-up survey of the patient confirms presence of the radioactive seed. Order number of I-125 seed:  [id number]. Total activity:  1.368 millicuries reference Date: 08/22/2021 The patient tolerated the procedure well and was released from the [facility]. She was given instructions regarding seed removal. IMPRESSION: Radioactive seed localization right breast. No apparent complications. Electronically Signed   By: Ammie Ferrier M.D.   On: 09/25/2021 13:44   Impression:   S/p right breast lumpectomy: no evidence of residual invasive or in-situ carcinoma   Stage IA (cT1b, cN0, cM0) Right Breast UOQ, Invasive and in-situ ductal carcinoma, ER+ / PR- / Her2 -, Grade 1-2  ***  Plan:  Patient is scheduled for CT simulation {date/later today}. ***  -----------------------------------  Blair Promise, PhD, MD  This document serves as a record of services personally performed by Gery Pray, MD. It was created on his behalf by Roney Mans, a trained medical scribe. The creation of this record is based on  the scribe's personal observations and the provider's statements to them. This document has been checked and approved by the attending provider.

## 2021-10-14 NOTE — Progress Notes (Signed)
Patient Care Team: [place], Blondell Reveal, MD as PCP - General (Family Medicine) Mauro Kaufmann, RN as Oncology Nurse Navigator Rockwell Germany, RN as Oncology Nurse Navigator  DIAGNOSIS:  Encounter Diagnoses  Name Primary?   Malignant neoplasm of upper-outer quadrant of right breast in female, estrogen receptor positive ([place]) Yes   Post-menopausal     SUMMARY OF ONCOLOGIC HISTORY: Oncology History  Malignant neoplasm of upper-outer quadrant of right breast in female, estrogen receptor positive ([place])  08/21/2021 Initial Diagnosis   Screening mammogram detected right breast calcifications 6 mm group UOQ stereotactic biopsy revealed grade 1-2 IDC with DCIS ER 100%, PR 0%, Ki-67 less than 5%, HER2 equivocal FISH pending   09/26/2021 Surgery    Right lumpectomy: Benign no evidence of residual cancer.  0/2 lymph nodes negative (based on the biopsy tumor size is 0.3 cm)     CHIEF COMPLIANT: Follow-up after post op  INTERVAL HISTORY: Judy Donovan is a 71 y.o. female is here because of recent diagnosis of right breast cancer. She states surgery went very well. She had no complaints.    ALLERGIES:  is allergic to prolia [denosumab] and amoxicillin.  MEDICATIONS:  Current Outpatient Medications  Medication Sig Dispense Refill   letrozole (FEMARA) 2.5 MG tablet Take 1 tablet (2.5 mg total) by mouth daily. 90 tablet 3   aspirin 81 MG tablet Take 81 mg by mouth daily.     cetirizine (ZYRTEC) 5 MG tablet Take 5 mg by mouth as needed.      ketoconazole (NIZORAL) 2 % cream Apply 1 Application topically 2 (two) times daily. To affected areas. 60 g 1   Lansoprazole (PREVACID PO) Take by mouth as needed.     LORazepam (ATIVAN) 1 MG tablet Take 0.5 tablets (0.5 mg total) by mouth 2 (two) times daily as needed for anxiety. 15 tablet 0   MAGNESIUM PO Take 350 mg by mouth at bedtime.     Omega-3 Fatty Acids (FISH OIL PO) Take by mouth daily. 1 teaspoon in pm     OVER THE COUNTER MEDICATION Take  2 drops by mouth at bedtime. Pt takes sleep well medication     oxyCODONE (ROXICODONE) 5 MG immediate release tablet Take 1 tablet (5 mg total) by mouth every 6 (six) hours as needed for severe pain. 10 tablet 0   Probiotic Product (ALIGN PO) Take 1 capsule by mouth daily.     No current facility-administered medications for this visit.    PHYSICAL EXAMINATION: ECOG PERFORMANCE STATUS: 1 - Symptomatic but completely ambulatory  Vitals:   10/14/21 0802  BP: (!) 147/85  Pulse: 86  Resp: 18  Temp: 97.9 F (36.6 C)  SpO2: 99%   Filed Weights   10/14/21 0802  Weight: 170 lb 9.6 oz (77.4 kg)      LABORATORY DATA:  I have reviewed the data as listed    Latest Ref Rng & Units 08/27/2021   12:33 PM 07/28/2021    8:40 AM 04/23/2015   11:22 AM  CMP  Glucose 70 - 99 mg/dL 84  97    BUN 8 - 23 mg/dL 10  12    Creatinine 0.44 - 1.00 mg/dL 0.92  0.78    Sodium 135 - 145 mmol/L 142  141    Potassium 3.5 - 5.1 mmol/L 3.8  4.0    Chloride 98 - 111 mmol/L 107  103    CO2 22 - 32 mmol/L 28  24    Calcium  8.9 - 10.3 mg/dL 9.4  9.2  9.2   Total Protein 6.5 - 8.1 g/dL 7.6  7.0    Total Bilirubin 0.3 - 1.2 mg/dL 0.4  0.3    Alkaline Phos 38 - 126 U/L 73  94    AST 15 - 41 U/L 25  25    ALT 0 - 44 U/L 26  23      Lab Results  Component Value Date   WBC 5.6 08/27/2021   HGB 14.1 08/27/2021   HCT 41.1 08/27/2021   MCV 88.0 08/27/2021   PLT 207 08/27/2021   NEUTROABS 3.2 08/27/2021    ASSESSMENT & PLAN:  Malignant neoplasm of upper-outer quadrant of right breast in female, estrogen receptor positive ([place]) 08/21/2021:Screening mammogram detected right breast calcifications 6 mm group UOQ stereotactic biopsy revealed grade 1-2 IDC with DCIS ER 100%, PR 0%, Ki-67 less than 5%, HER2 equivocal FISH negative  09/26/21: Right lumpectomy: Benign no evidence of residual cancer.  0/2 lymph nodes negative (based on the biopsy tumor size is 0.3 cm)  Treatment plan: 1.  Adjuvant radiation  therapy 2. adjuvant antiestrogen therapy  Return to clinic after radiation is complete.       Orders Placed This Encounter  Procedures   DG Bone Density    Standing Status:   Future    Standing Expiration Date:   10/15/2022    Order Specific Question:   Reason for Exam (SYMPTOM  OR DIAGNOSIS REQUIRED)    Answer:   post menopausal    Order Specific Question:   Preferred imaging location?    Answer:   [facility]   The patient has a good understanding of the overall plan. she agrees with it. she will call with any problems that may develop before the next visit here. Total time spent: 30 mins including face to face time and time spent for planning, charting and co-ordination of care   Harriette Ohara, MD 10/14/21    I Gardiner Coins am scribing for Dr. Lindi Adie  I have reviewed the above documentation for accuracy and completeness, and I agree with the above.

## 2021-10-14 NOTE — Assessment & Plan Note (Addendum)
08/21/2021:Screening mammogram detected right breast calcifications 6 mm group UOQ stereotactic biopsy revealed grade 1-2 IDC with DCIS ER 100%, PR 0%, Ki-67 less than 5%, HER2 equivocal FISH negative  09/26/21: Right lumpectomy: Benign no evidence of residual cancer.  0/2 lymph nodes negative (based on the biopsy tumor size is 0.3 cm)  Treatment plan: 1.  Adjuvant radiation therapy 2. adjuvant antiestrogen therapy  Return to clinic after radiation is complete.

## 2021-10-15 ENCOUNTER — Encounter: Payer: Self-pay | Admitting: Radiation Oncology

## 2021-10-15 ENCOUNTER — Ambulatory Visit: Payer: Medicare Other | Admitting: Radiation Oncology

## 2021-10-15 ENCOUNTER — Ambulatory Visit
Admission: RE | Admit: 2021-10-15 | Discharge: 2021-10-15 | Disposition: A | Discharge status: Home / Self Care | Payer: Medicare Other | Source: Ambulatory Visit | Attending: Radiation Oncology | Admitting: Radiation Oncology

## 2021-10-15 VITALS — BP 148/69 | HR 92 | Temp 97.4°F | Resp 20 | Ht 65.0 in | Wt 170.4 lb

## 2021-10-15 DIAGNOSIS — Z79811 Long term (current) use of aromatase inhibitors: Secondary | ICD-10-CM | POA: Insufficient documentation

## 2021-10-15 DIAGNOSIS — Z7982 Long term (current) use of aspirin: Secondary | ICD-10-CM | POA: Insufficient documentation

## 2021-10-15 DIAGNOSIS — C50411 Malignant neoplasm of upper-outer quadrant of right female breast: Secondary | ICD-10-CM | POA: Diagnosis present

## 2021-10-15 DIAGNOSIS — Z170 Estrogen receptor positive status [ER+]: Secondary | ICD-10-CM | POA: Insufficient documentation

## 2021-10-16 NOTE — Therapy (Signed)
OUTPATIENT PHYSICAL THERAPY BREAST CANCER POST OP FOLLOW UP   Patient Name: Judy Donovan MRN: [id number] DOB:[birth year], 71 y.o., female Today's Date: 10/17/2021   PT End of Session - 10/17/21 0858     Visit Number 2    Number of Visits 2    Date for PT Re-Evaluation 10/17/21    PT Start Time 0900    PT Stop Time 4696    PT Time Calculation (min) 42 min    Activity Tolerance Patient tolerated treatment well    Behavior During Therapy [facility] for tasks assessed/performed             Past Medical History:  Diagnosis Date   Cataracts, bilateral    GERD (gastroesophageal reflux disease)    History of uterine fibroid    Internal and external hemorrhoids without complication    Osteoporosis    PONV (postoperative nausea and vomiting)    Past Surgical History:  Procedure Laterality Date   BREAST BIOPSY Right 08/21/2021   BREAST LUMPECTOMY WITH RADIOACTIVE SEED AND SENTINEL LYMPH NODE BIOPSY Right 09/26/2021   Procedure: RIGHT BREAST RADIOACTIVE SEED LOCALIZED LUMPECTOMY AND SENTINEL NODE BIOPSY;  Surgeon: Jovita Kussmaul, MD;  Location: [place];  Service: General;  Laterality: Right;   CATARACT EXTRACTION W/ INTRAOCULAR LENS  IMPLANT, BILATERAL  2015   HEMORRHOID SURGERY N/A 11/19/2015   Procedure: HEMORRHOIDECTOMY;  Surgeon: Leighton Ruff, MD;  Location: [place];  Service: General;  Laterality: N/A;   TUBAL LIGATION Bilateral 1980's   VAGINAL HYSTERECTOMY  05/12/2004   w/  Bilateral Salpingoophorectomy   Patient Active Problem List   Diagnosis Date Noted   Sleep disturbance 09/04/2021   Malignant neoplasm of upper-outer quadrant of right breast in female, estrogen receptor positive ([place]) 08/25/2021   Wellness examination 08/06/2021   Hyperlipidemia 06/10/2021   Osteoporosis 11/01/2015   Cataracts, bilateral 11/01/2015    PCP: Debbra Riding  REFERRING PROVIDER: Nicholas Lose MD  REFERRING DIAG: Right Breast Cancer  THERAPY DIAG:   Abnormal posture  Malignant neoplasm of upper-outer quadrant of right breast in female, estrogen receptor positive ([place])  Rationale for Evaluation and Treatment Rehabilitation  ONSET DATE: 08/13/2021  SUBJECTIVE:                                                                                                                                                                                           SUBJECTIVE STATEMENT: I am doing great overall. The only thing that is bothering me is the glue on my incision. I don't have to have radiation.  I started letrozole but I can't sleep well. I feel like my  ROM is back to normal.  PERTINENT HISTORY:  Screening mammogram on 08/13/2021 detected 71 year old female recalled from screening mammogram dated 08/13/2021 for right breast calcifications. 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 She had a right lumpectomy with SLNB on 09/26/2021 with 0/2 LN. Plan is  antiestrogen therapy  PATIENT GOALS:  Reassess how my recovery is going related to arm function, pain, and swelling.  PAIN:  Are you having pain? No  PRECAUTIONS: Recent Surgery, right UE Lymphedema risk,   ACTIVITY LEVEL / LEISURE: walking   OBJECTIVE:   PATIENT SURVEYS:  QUICK DASH: 0%  OBSERVATIONS:  Incisions healed;very mild swelling at axillary incision, glue still present over incision at axilla and areolar incision.  POSTURE:  Forward head, rounded shoulders increased thoracic kyphosis  LYMPHEDEMA ASSESSMENT:     A/PROM RIGHT   eval   10/17/2021  Shoulder extension 45 55  Shoulder flexion 135 155  Shoulder abduction 168 180  Shoulder internal rotation 30 65  Shoulder external rotation 75 90                          (Blank rows = not tested)   A/PROM LEFT   eval  Shoulder extension 50   Shoulder flexion 142  Shoulder abduction 170  Shoulder internal rotation 40  Shoulder external rotation 90                          (Blank rows = not tested)     CERVICAL AROM: All within normal limits:          UPPER EXTREMITY STRENGTH: WFL     LYMPHEDEMA ASSESSMENTS:    Pt has area of firm "swelling" in  right proximal forearm that she has had for several years. It is sometimes painful. It is laterally moveable with no pain    LANDMARK RIGHT   eval   10 cm proximal to olecranon process 30.5 29.4  Olecranon process 25.5 25.0  10 cm proximal to ulnar styloid process 21.5 21.0  Just proximal to ulnar styloid process 16 15.9  Across hand at thumb web space 19 18.9  At base of 2nd digit 6.5 6.4  (Blank rows = not tested)   LANDMARK LEFT   eval  10 cm proximal to olecranon process 28.5  Olecranon process 24.5  10 cm proximal to ulnar styloid process 18.5  Just proximal to ulnar styloid process 15  Across hand at thumb web space 18.5  At base of 2nd digit 6.3  (Blank rows = not tested     Surgery type/Date: 09/26/2021, Right lumpectomy with SLNB Number of lymph nodes removed: 0/2 Current/past treatment (chemo, radiation, hormone therapy): pending radiation and anti estrogens Other symptoms:  Heaviness/tightness No Pain No Pitting edema No Infections No Decreased scar mobility Yes Stemmer sign No   PATIENT EDUCATION:  Education details: scar mobilization, compression bra, SOZO ,screens ABC class Person educated: Patient Education method: Explanation, Demonstration, and Handouts Education comprehension: verbalized understanding   HOME EXERCISE PROGRAM:  Discussed  previously given post op HEP. Advised to apply cocoa butter or lotion to incision area/glue to help it come off. Scar massage  ASSESSMENT:  CLINICAL IMPRESSION: Pt is s/p Right lumpectomy with SLNB on 09/26/2021. She is doing exceptionally well with  ROM exceeding her pre-op visit, and no signs of  swelling.  Incisions are healed with glue still present. She was instructed in scar mobilization, proper posture, walking program, ABC Class and SOZO screens. She has concerns about poor sleep since taking Letrozole and also the cost. Advised to call [facility] about her concerns. She has very mild axillary swelling and was advised to continue to wear her compression bra until swelling resolves.  She has no further PT needs identified at this time.  Pt will benefit from skilled therapeutic intervention to improve on the following deficits: Decreased knowledge of precautions, impaired UE functional use, pain, decreased ROM, postural dysfunction.   PT treatment/interventions: ADL/Self care home management, Therapeutic exercises, Patient/Family education, and Self Care     GOALS: Goals reviewed with patient? Yes  LONG TERM GOALS:  (STG=LTG)  GOALS Name Target Date  Goal status  1 Pt will demonstrate she has regained full shoulder ROM and function post operatively compared to baselines.  Baseline: 10/17/2021 MET  _0 PLAN: PT FREQUENCY/DURATION: No further needs identified  PLAN FOR NEXT SESSION: Pt is discharged to HEP/self care due to no further needs identified. She will attend ABC class on line, and SOZO screens every 3 months for 2 years unless further needs are identified. PHYSICAL THERAPY DISCHARGE SUMMARY  Visits from Start of Care: 2  Current functional level related to goals / functional outcomes: Achieved goals   Remaining deficits: Mild axillary edema   Education / Equipment: NA   Patient agrees to discharge. Patient goals were met. Patient is being discharged due to meeting the stated rehab goals.   [facility]  [address], [address]  [place] [address]  [phone]  After Breast Cancer Class It is recommended you attend the ABC class to be educated on lymphedema risk reduction. This class is free of charge and  lasts for 1 hour. It is a 1-time class. You will need to download the Webex app either on your phone or computer. We will send you a link the night before or the morning of the class. You should be able to click on that link to join the class. This is not a confidential class. You don't have to turn your camera on, but other participants may be able to see your email address.  Scar massage You can begin gentle scar massage to you incision sites. Gently place one hand on the incision and move the skin (without sliding on the skin) in various directions. Do this for a few minutes and then you can gently massage either coconut oil or vitamin E cream into the scars.  Compression garment You should continue wearing your compression bra until you feel like you no longer have swelling.  Home exercise Program Continue doing the exercises you were given until you feel like you can do them without feeling any tightness at the end.   Walking Program Studies show that 30 minutes of walking per day (fast enough to elevate your heart rate) can significantly reduce the risk of a cancer recurrence. If you can't walk due to other medical reasons, we encourage you to find another activity you could do (like a stationary bike or water exercise).  Posture After breast cancer surgery, people frequently sit with rounded shoulders posture because it puts their incisions on slack and feels better. If you sit like this and scar  tissue forms in that position, you can become very tight and have pain sitting or standing with good posture. Try to be aware of your posture and sit and stand up tall to heal properly.  Follow up PT: It is recommended you return every 3 months for the first 3 years following surgery to be assessed on the SOZO machine for an L-Dex score. This helps prevent clinically significant lymphedema in 95% of patients. These follow up screens are 10 minute appointments that you are not billed for.  Claris Pong, PT 10/17/2021, 9:43 AM

## 2021-10-17 ENCOUNTER — Ambulatory Visit: Payer: Medicare Other | Attending: Hematology and Oncology

## 2021-10-17 DIAGNOSIS — C50411 Malignant neoplasm of upper-outer quadrant of right female breast: Secondary | ICD-10-CM | POA: Diagnosis present

## 2021-10-17 DIAGNOSIS — Z170 Estrogen receptor positive status [ER+]: Secondary | ICD-10-CM | POA: Diagnosis present

## 2021-10-17 DIAGNOSIS — R293 Abnormal posture: Secondary | ICD-10-CM | POA: Insufficient documentation

## 2021-10-17 NOTE — Patient Instructions (Signed)
     [facility]  [address], [address]  [place] [address]  [phone]  After Breast Cancer Class It is recommended you attend the ABC class to be educated on lymphedema risk reduction. This class is free of charge and lasts for 1 hour. It is a 1-time class. You will need to download the Webex app either on your phone or computer. We will send you a link the night before or the morning of the class. You should be able to click on that link to join the class. This is not a confidential class. You don't have to turn your camera on, but other participants may be able to see your email address.  Scar massage You can begin gentle scar massage to you incision sites. Gently place one hand on the incision and move the skin (without sliding on the skin) in various directions. Do this for a few minutes and then you can gently massage either coconut oil or vitamin E cream into the scars.  Compression garment You should continue wearing your compression bra until you feel like you no longer have swelling.  Home exercise Program Continue doing the exercises you were given until you feel like you can do them without feeling any tightness at the end.   Walking Program Studies show that 30 minutes of walking per day (fast enough to elevate your heart rate) can significantly reduce the risk of a cancer recurrence. If you can't walk due to other medical reasons, we encourage you to find another activity you could do (like a stationary bike or water exercise).  Posture After breast cancer surgery, people frequently sit with rounded shoulders posture because it puts their incisions on slack and feels better. If you sit like this and scar tissue forms in that position, you can become very tight and have pain sitting or standing with good posture. Try to be aware of your posture and sit and stand up tall to heal properly.  Follow up PT: It is recommended you return every 3 months for  the first 2 years following surgery to be assessed on the SOZO machine for an L-Dex score. This helps prevent clinically significant lymphedema in 95% of patients. These follow up screens are 10 minute appointments that you are not billed for.  

## 2021-10-22 ENCOUNTER — Ambulatory Visit: Payer: No Typology Code available for payment source

## 2021-10-22 ENCOUNTER — Encounter (HOSPITAL_COMMUNITY): Payer: Self-pay

## 2021-10-22 ENCOUNTER — Ambulatory Visit: Payer: No Typology Code available for payment source | Admitting: Radiation Oncology

## 2021-10-22 ENCOUNTER — Ambulatory Visit: Payer: Medicare Other | Admitting: Radiation Oncology

## 2021-10-28 ENCOUNTER — Encounter: Payer: Self-pay | Admitting: *Deleted

## 2021-10-28 DIAGNOSIS — Z170 Estrogen receptor positive status [ER+]: Secondary | ICD-10-CM

## 2021-10-29 ENCOUNTER — Telehealth: Payer: Self-pay | Admitting: Hematology and Oncology

## 2021-10-29 NOTE — Telephone Encounter (Signed)
Called to r/s per 9/13 in basket, pt stated they do not want SCP visit at this time, said they will call back if they want to r/s

## 2021-11-03 NOTE — Telephone Encounter (Signed)
Error

## 2021-11-24 ENCOUNTER — Other Ambulatory Visit (HOSPITAL_BASED_OUTPATIENT_CLINIC_OR_DEPARTMENT_OTHER): Payer: Self-pay | Admitting: Family Medicine

## 2021-11-25 NOTE — Telephone Encounter (Signed)
Error

## 2021-11-25 NOTE — Telephone Encounter (Signed)
Pt is schedule

## 2021-12-29 ENCOUNTER — Ambulatory Visit: Payer: Medicare Other | Attending: Hematology and Oncology

## 2021-12-29 VITALS — Wt 168.2 lb

## 2021-12-29 DIAGNOSIS — Z483 Aftercare following surgery for neoplasm: Secondary | ICD-10-CM | POA: Insufficient documentation

## 2021-12-29 NOTE — Therapy (Signed)
OUTPATIENT PHYSICAL THERAPY SOZO SCREENING NOTE   Patient Name: Judy Donovan MRN: [id number] DOB:Dec 03, 1950, 71 y.o., female Today's Date: 12/29/2021  PCP: Tennis Must [place], Blondell Reveal, MD REFERRING PROVIDER: Nicholas Lose, MD   PT End of Session - 12/29/21 [phone]     Visit Number 2   # unchanged due to screen only   PT Start Time 0908    PT Stop Time 0917    PT Time Calculation (min) 9 min    Activity Tolerance Patient tolerated treatment well    Behavior During Therapy [facility] for tasks assessed/performed             Past Medical History:  Diagnosis Date   Cataracts, bilateral    GERD (gastroesophageal reflux disease)    History of uterine fibroid    Internal and external hemorrhoids without complication    Osteoporosis    PONV (postoperative nausea and vomiting)    Past Surgical History:  Procedure Laterality Date   BREAST BIOPSY Right 08/21/2021   BREAST LUMPECTOMY WITH RADIOACTIVE SEED AND SENTINEL LYMPH NODE BIOPSY Right 09/26/2021   Procedure: RIGHT BREAST RADIOACTIVE SEED LOCALIZED LUMPECTOMY AND SENTINEL NODE BIOPSY;  Surgeon: Jovita Kussmaul, MD;  Location: [place];  Service: General;  Laterality: Right;   CATARACT EXTRACTION W/ INTRAOCULAR LENS  IMPLANT, BILATERAL  2015   HEMORRHOID SURGERY N/A 11/19/2015   Procedure: HEMORRHOIDECTOMY;  Surgeon: Leighton Ruff, MD;  Location: [place];  Service: General;  Laterality: N/A;   TUBAL LIGATION Bilateral 1980's   VAGINAL HYSTERECTOMY  05/12/2004   w/  Bilateral Salpingoophorectomy   Patient Active Problem List   Diagnosis Date Noted   Sleep disturbance 09/04/2021   Malignant neoplasm of upper-outer quadrant of right breast in female, estrogen receptor positive ([place]) 08/25/2021   Wellness examination 08/06/2021   Hyperlipidemia 06/10/2021   Osteoporosis 11/01/2015   Cataracts, bilateral 11/01/2015    REFERRING DIAG: right breast cancer at risk for lymphedema  THERAPY DIAG: Aftercare  following surgery for neoplasm  PERTINENT HISTORY: Screening mammogram on 08/13/2021 detected 71 year old female recalled from screening mammogram dated 08/13/2021 for right breast calcifications. 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 She had a right lumpectomy with SLNB on 09/26/2021 with 0/2 LN. Plan is  antiestrogen therapy  PRECAUTIONS: right UE Lymphedema risk, None  SUBJECTIVE: Pt returns for her first 3 month L-Dex screen.   PAIN:  Are you having pain? No  SOZO SCREENING: Patient was assessed today using the SOZO machine to determine the lymphedema index score. This was compared to her baseline score. It was determined that she is within the recommended range when compared to her baseline and no further action is needed at this time. She will continue SOZO screenings. These are done every 3 months for 2 years post operatively followed by every 6 months for 2 years, and then annually.   L-DEX FLOWSHEETS - 12/29/21 0900       L-DEX LYMPHEDEMA SCREENING   Measurement Type Unilateral    L-DEX MEASUREMENT EXTREMITY Upper Extremity    POSITION  Standing    DOMINANT SIDE Right    At Risk Side Right    BASELINE SCORE (UNILATERAL) 3.4    L-DEX SCORE (UNILATERAL) 5.4    VALUE CHANGE (UNILAT) 2  Otelia Limes, PTA 12/29/2021, 9:17 AM

## 2022-01-14 ENCOUNTER — Ambulatory Visit (HOSPITAL_BASED_OUTPATIENT_CLINIC_OR_DEPARTMENT_OTHER)
Admission: RE | Admit: 2022-01-14 | Discharge: 2022-01-14 | Disposition: A | Discharge status: Home / Self Care | Payer: Medicare Other | Source: Ambulatory Visit | Attending: Hematology and Oncology | Admitting: Hematology and Oncology

## 2022-01-14 ENCOUNTER — Encounter: Payer: No Typology Code available for payment source | Admitting: Adult Health

## 2022-01-14 DIAGNOSIS — Z780 Asymptomatic menopausal state: Secondary | ICD-10-CM | POA: Diagnosis present

## 2022-01-14 DIAGNOSIS — C50411 Malignant neoplasm of upper-outer quadrant of right female breast: Secondary | ICD-10-CM | POA: Diagnosis present

## 2022-01-14 DIAGNOSIS — Z170 Estrogen receptor positive status [ER+]: Secondary | ICD-10-CM | POA: Insufficient documentation

## 2022-01-21 ENCOUNTER — Ambulatory Visit (HOSPITAL_BASED_OUTPATIENT_CLINIC_OR_DEPARTMENT_OTHER): Payer: No Typology Code available for payment source | Admitting: Family Medicine

## 2022-02-18 DIAGNOSIS — Z961 Presence of intraocular lens: Secondary | ICD-10-CM | POA: Diagnosis not present

## 2022-02-18 DIAGNOSIS — H16223 Keratoconjunctivitis sicca, not specified as Sjogren's, bilateral: Secondary | ICD-10-CM | POA: Diagnosis not present

## 2022-02-19 ENCOUNTER — Other Ambulatory Visit: Payer: Self-pay | Admitting: *Deleted

## 2022-02-19 NOTE — Progress Notes (Signed)
Received call from pt with complaint of ongoing rash under her chin and neck.  Pt states rash comes and goes x several months and requesting advice from MD if rash could be related to Letrozole.  Per MD pt needing to stop Letrozole x2 weeks and f/u in office.  Pt educated and verbalized understanding.

## 2022-02-23 DIAGNOSIS — K080 Exfoliation of teeth due to systemic causes: Secondary | ICD-10-CM | POA: Diagnosis not present

## 2022-03-06 ENCOUNTER — Inpatient Hospital Stay: Payer: Medicare Other | Attending: Hematology and Oncology | Admitting: Hematology and Oncology

## 2022-03-06 VITALS — BP 148/86 | HR 87 | Temp 97.3°F | Resp 17 | Wt 167.9 lb

## 2022-03-06 DIAGNOSIS — C50412 Malignant neoplasm of upper-outer quadrant of left female breast: Secondary | ICD-10-CM | POA: Diagnosis not present

## 2022-03-06 DIAGNOSIS — M810 Age-related osteoporosis without current pathological fracture: Secondary | ICD-10-CM | POA: Diagnosis not present

## 2022-03-06 DIAGNOSIS — Z923 Personal history of irradiation: Secondary | ICD-10-CM | POA: Insufficient documentation

## 2022-03-06 DIAGNOSIS — C50411 Malignant neoplasm of upper-outer quadrant of right female breast: Secondary | ICD-10-CM | POA: Diagnosis not present

## 2022-03-06 DIAGNOSIS — Z170 Estrogen receptor positive status [ER+]: Secondary | ICD-10-CM | POA: Diagnosis not present

## 2022-03-06 NOTE — Assessment & Plan Note (Addendum)
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  0/2 lymph nodes negative (based on the biopsy tumor size is 0.3 cm)   Treatment plan: 1.  Adjuvant radiation therapy (based upon her age and favorable surgical results the decision was made to omit radiation) 2. adjuvant antiestrogen therapy started November 2023-discontinued 02/20/2022 due to rash  Anastrozole toxicities: Rash on the chin and neck which led to discontinuation of anastrozole therapy Rash did not resolve even after stopping anastrozole.  It looks like eczema.  She has an appoint with dermatologist coming up next week. The other side effect of anastrozole was easy irritability.  Irritability has resolved after she stopped anastrozole. She does not want to take any other antiestrogen therapy.  Return to clinic once a year for follow-ups.

## 2022-03-06 NOTE — Progress Notes (Signed)
Patient Care Team: de [place], Blondell Reveal, MD as PCP - General (Family Medicine) Mauro Kaufmann, RN as Oncology Nurse Navigator Rockwell Germany, RN as Oncology Nurse Navigator  DIAGNOSIS:  Encounter Diagnosis  Name Primary?   Malignant neoplasm of upper-outer quadrant of right breast in female, estrogen receptor positive ([place]) Yes    SUMMARY OF ONCOLOGIC HISTORY: Oncology History  Malignant neoplasm of upper-outer quadrant of right breast in female, estrogen receptor positive ([place])  08/21/2021 Initial Diagnosis   Screening mammogram detected right breast calcifications 6 mm group UOQ stereotactic biopsy revealed grade 1-2 IDC with DCIS ER 100%, PR 0%, Ki-67 less than 5%, HER2 equivocal FISH pending   09/26/2021 Surgery    Right lumpectomy: Benign no evidence of residual cancer.  0/2 lymph nodes negative (based on the biopsy tumor size is 0.3 cm)     CHIEF COMPLIANT: Discuss bone density and rash  INTERVAL HISTORY: Judy Donovan is a 72 y.o. female is here because of recent diagnosis of right breast cancer. She presents to the clinic today to discuss bone density and rash. She states that she feels better since she stop taking the letrozole, as far as been agitated, but the rash did not go away. She does have a dermatologist appointment on Tuesday. She complains of right leg aches.   ALLERGIES:  is allergic to prolia [denosumab] and amoxicillin.  MEDICATIONS:  Current Outpatient Medications  Medication Sig Dispense Refill   aspirin 81 MG tablet Take 81 mg by mouth daily.     cetirizine (ZYRTEC) 5 MG tablet Take 5 mg by mouth as needed.      Lansoprazole (PREVACID PO) Take by mouth as needed.     MAGNESIUM PO Take 350 mg by mouth at bedtime.     Probiotic Product (ALIGN PO) Take 1 capsule by mouth daily.     Omega-3 Fatty Acids (FISH OIL PO) Take by mouth daily. 1 teaspoon in pm (Patient not taking: Reported on 03/06/2022)     No current facility-administered medications for  this visit.    PHYSICAL EXAMINATION: ECOG PERFORMANCE STATUS: 1 - Symptomatic but completely ambulatory  Vitals:   03/06/22 0848  BP: (!) 148/86  Pulse: 87  Resp: 17  Temp: (!) 97.3 F (36.3 C)  SpO2: 100%   Filed Weights   03/06/22 0848  Weight: 167 lb 14.4 oz (76.2 kg)      LABORATORY DATA:  I have reviewed the data as listed    Latest Ref Rng & Units 08/27/2021   12:33 PM 07/28/2021    8:40 AM 04/23/2015   11:22 AM  CMP  Glucose 70 - 99 mg/dL 84  97    BUN 8 - 23 mg/dL 10  12    Creatinine 0.44 - 1.00 mg/dL 0.92  0.78    Sodium 135 - 145 mmol/L 142  141    Potassium 3.5 - 5.1 mmol/L 3.8  4.0    Chloride 98 - 111 mmol/L 107  103    CO2 22 - 32 mmol/L 28  24    Calcium 8.9 - 10.3 mg/dL 9.4  9.2  9.2   Total Protein 6.5 - 8.1 g/dL 7.6  7.0    Total Bilirubin 0.3 - 1.2 mg/dL 0.4  0.3    Alkaline Phos 38 - 126 U/L 73  94    AST 15 - 41 U/L 25  25    ALT 0 - 44 U/L 26  23      Lab  Results  Component Value Date   WBC 5.6 08/27/2021   HGB 14.1 08/27/2021   HCT 41.1 08/27/2021   MCV 88.0 08/27/2021   PLT 207 08/27/2021   NEUTROABS 3.2 08/27/2021    ASSESSMENT & PLAN:  Malignant neoplasm of upper-outer quadrant of right breast in female, estrogen receptor positive ([place]) 08/21/2021:Screening mammogram detected right breast calcifications 6 mm group UOQ stereotactic biopsy revealed 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  0/2 lymph nodes negative (based on the biopsy tumor size is 0.3 cm)   Treatment plan: 1.  Adjuvant radiation therapy (based upon her age and favorable surgical results the decision was made to omit radiation) 2. adjuvant antiestrogen therapy started November 2023-discontinued 02/20/2022 due to rash  Anastrozole toxicities: Rash on the chin and neck which led to discontinuation of anastrozole therapy Rash did not resolve even after  stopping anastrozole.  It looks like eczema.  She has an appoint with dermatologist coming up next week. The other side effect of anastrozole was easy irritability.  Irritability has resolved after she stopped anastrozole. She does not want to take any other antiestrogen therapy.  Osteoporosis: Bone density showed a T-score of -2.6: I discussed with her about exercise and calcium and vitamin D.  She also would benefit from bisphosphonate therapy.  She will discuss with her primary care physician.  She does not want take any medications.  If that is the case she might need a bone density next year to monitor.  Return to clinic once a year for follow-ups.   Orders Placed This Encounter  Procedures   MM DIAG BREAST TOMO BILATERAL    Standing Status:   Future    Standing Expiration Date:   03/07/2023    Order Specific Question:   Reason for Exam (SYMPTOM  OR DIAGNOSIS REQUIRED)    Answer:   annual mammogram    Order Specific Question:   Preferred imaging location?    Answer:   [facility]   The patient has a good understanding of the overall plan. she agrees with it. she will call with any problems that may develop before the next visit here. Total time spent: 30 mins including face to face time and time spent for planning, charting and co-ordination of care   Judy Ohara, MD 03/06/22   I Gardiner Coins am acting as a Education administrator for [employer]  I have reviewed the above documentation for accuracy and completeness, and I agree with the above.

## 2022-03-09 ENCOUNTER — Telehealth: Payer: Self-pay | Admitting: Hematology and Oncology

## 2022-03-09 NOTE — Telephone Encounter (Signed)
Scheduled appointment per 1/19 los. Patient is aware.

## 2022-03-10 DIAGNOSIS — L239 Allergic contact dermatitis, unspecified cause: Secondary | ICD-10-CM | POA: Diagnosis not present

## 2022-03-13 DIAGNOSIS — Z853 Personal history of malignant neoplasm of breast: Secondary | ICD-10-CM | POA: Diagnosis not present

## 2022-03-13 DIAGNOSIS — M810 Age-related osteoporosis without current pathological fracture: Secondary | ICD-10-CM | POA: Diagnosis not present

## 2022-03-13 DIAGNOSIS — J309 Allergic rhinitis, unspecified: Secondary | ICD-10-CM | POA: Diagnosis not present

## 2022-04-13 ENCOUNTER — Ambulatory Visit: Payer: Medicare Other | Attending: Hematology and Oncology

## 2022-04-13 VITALS — Wt 170.2 lb

## 2022-04-13 DIAGNOSIS — Z483 Aftercare following surgery for neoplasm: Secondary | ICD-10-CM | POA: Insufficient documentation

## 2022-04-13 NOTE — Therapy (Signed)
OUTPATIENT PHYSICAL THERAPY SOZO SCREENING NOTE   Patient Name: Judy Donovan MRN: [id number] DOB:[birth year], 72 y.o., female Today's Date: 04/13/2022  PCP: No primary care provider on file. REFERRING PROVIDER: Nicholas Lose, MD   PT End of Session - 04/13/22 [id number]     Visit Number 2   # unchanged due to screen only   PT Start Time 0911    PT Stop Time 0915    PT Time Calculation (min) 4 min    Activity Tolerance Patient tolerated treatment well    Behavior During Therapy [facility] for tasks assessed/performed             Past Medical History:  Diagnosis Date   Cataracts, bilateral    GERD (gastroesophageal reflux disease)    History of uterine fibroid    Internal and external hemorrhoids without complication    Osteoporosis    PONV (postoperative nausea and vomiting)    Past Surgical History:  Procedure Laterality Date   BREAST BIOPSY Right 08/21/2021   BREAST LUMPECTOMY WITH RADIOACTIVE SEED AND SENTINEL LYMPH NODE BIOPSY Right 09/26/2021   Procedure: RIGHT BREAST RADIOACTIVE SEED LOCALIZED LUMPECTOMY AND SENTINEL NODE BIOPSY;  Surgeon: Jovita Kussmaul, MD;  Location: [place];  Service: General;  Laterality: Right;   CATARACT EXTRACTION W/ INTRAOCULAR LENS  IMPLANT, BILATERAL  2015   HEMORRHOID SURGERY N/A 11/19/2015   Procedure: HEMORRHOIDECTOMY;  Surgeon: Leighton Ruff, MD;  Location: [place];  Service: General;  Laterality: N/A;   TUBAL LIGATION Bilateral 1980's   VAGINAL HYSTERECTOMY  05/12/2004   w/  Bilateral Salpingoophorectomy   Patient Active Problem List   Diagnosis Date Noted   Sleep disturbance 09/04/2021   Malignant neoplasm of upper-outer quadrant of right breast in female, estrogen receptor positive ([place]) 08/25/2021   Wellness examination 08/06/2021   Hyperlipidemia 06/10/2021   Osteoporosis 11/01/2015   Cataracts, bilateral 11/01/2015    REFERRING DIAG: right breast cancer at risk for lymphedema  THERAPY DIAG:  Aftercare following surgery for neoplasm  PERTINENT HISTORY: Screening mammogram on 08/13/2021 detected 72 year old female recalled from screening mammogram dated 08/13/2021 for right breast calcifications. 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 She had a right lumpectomy with SLNB on 09/26/2021 with 0/2 LN. Plan is  antiestrogen therapy  PRECAUTIONS: right UE Lymphedema risk, None  SUBJECTIVE: Pt returns for her first 3 month L-Dex screen.   PAIN:  Are you having pain? No  SOZO SCREENING: Patient was assessed today using the SOZO machine to determine the lymphedema index score. This was compared to her baseline score. It was determined that she is within the recommended range when compared to her baseline and no further action is needed at this time. She will continue SOZO screenings. These are done every 3 months for 2 years post operatively followed by every 6 months for 2 years, and then annually.   L-DEX FLOWSHEETS - 04/13/22 0900       L-DEX LYMPHEDEMA SCREENING   Measurement Type Unilateral    L-DEX MEASUREMENT EXTREMITY Upper Extremity    POSITION  Standing    DOMINANT SIDE Right    At Risk Side Right    BASELINE SCORE (UNILATERAL) 3.4    L-DEX SCORE (UNILATERAL) 5.7    VALUE CHANGE (UNILAT) 2.3  Otelia Limes, PTA 04/13/2022, 9:15 AM

## 2022-04-14 DIAGNOSIS — Z170 Estrogen receptor positive status [ER+]: Secondary | ICD-10-CM | POA: Diagnosis not present

## 2022-04-14 DIAGNOSIS — C50411 Malignant neoplasm of upper-outer quadrant of right female breast: Secondary | ICD-10-CM | POA: Diagnosis not present

## 2022-05-11 DIAGNOSIS — C50911 Malignant neoplasm of unspecified site of right female breast: Secondary | ICD-10-CM | POA: Diagnosis not present

## 2022-07-06 ENCOUNTER — Ambulatory Visit: Payer: Medicare Other | Attending: Hematology and Oncology

## 2022-07-06 VITALS — Wt 169.5 lb

## 2022-07-06 DIAGNOSIS — Z483 Aftercare following surgery for neoplasm: Secondary | ICD-10-CM | POA: Insufficient documentation

## 2022-07-06 NOTE — Therapy (Signed)
OUTPATIENT PHYSICAL THERAPY SOZO SCREENING NOTE   Patient Name: Judy Donovan MRN: [id number] DOB:1950-12-17, 72 y.o., female Today's Date: 07/06/2022  PCP: No primary care provider on file. REFERRING PROVIDER: Serena Croissant, MD   PT End of Session - 07/06/22 [id number]     Visit Number 2   # unchanged due to screen only   PT Start Time 0905    PT Stop Time 0911    PT Time Calculation (min) 6 min    Activity Tolerance Patient tolerated treatment well    Behavior During Therapy [facility] for tasks assessed/performed             Past Medical History:  Diagnosis Date   Cataracts, bilateral    GERD (gastroesophageal reflux disease)    History of uterine fibroid    Internal and external hemorrhoids without complication    Osteoporosis    PONV (postoperative nausea and vomiting)    Past Surgical History:  Procedure Laterality Date   BREAST BIOPSY Right 08/21/2021   BREAST LUMPECTOMY WITH RADIOACTIVE SEED AND SENTINEL LYMPH NODE BIOPSY Right 09/26/2021   Procedure: RIGHT BREAST RADIOACTIVE SEED LOCALIZED LUMPECTOMY AND SENTINEL NODE BIOPSY;  Surgeon: Griselda Miner, MD;  Location: [facility];  Service: General;  Laterality: Right;   CATARACT EXTRACTION W/ INTRAOCULAR LENS  IMPLANT, BILATERAL  2015   HEMORRHOID SURGERY N/A 11/19/2015   Procedure: HEMORRHOIDECTOMY;  Surgeon: Romie Levee, MD;  Location: [facility];  Service: General;  Laterality: N/A;   TUBAL LIGATION Bilateral 1980's   VAGINAL HYSTERECTOMY  05/12/2004   w/  Bilateral Salpingoophorectomy   Patient Active Problem List   Diagnosis Date Noted   Sleep disturbance 09/04/2021   Malignant neoplasm of upper-outer quadrant of right breast in female, estrogen receptor positive (HCC) 08/25/2021   Wellness examination 08/06/2021   Hyperlipidemia 06/10/2021   Osteoporosis 11/01/2015   Cataracts, bilateral 11/01/2015    REFERRING DIAG: right breast cancer at risk for lymphedema  THERAPY DIAG:  Aftercare following surgery for neoplasm  PERTINENT HISTORY: Screening mammogram on 08/13/2021 detected 72 year old female recalled from screening mammogram dated 08/13/2021 for right breast calcifications. 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 She had a right lumpectomy with SLNB on 09/26/2021 with 0/2 LN. Plan is  antiestrogen therapy  PRECAUTIONS: right UE Lymphedema risk, None  SUBJECTIVE: Pt returns for her 3 month L-Dex screen.   PAIN:  Are you having pain? No  SOZO SCREENING: Patient was assessed today using the SOZO machine to determine the lymphedema index score. This was compared to her baseline score. It was determined that she is within the recommended range when compared to her baseline and no further action is needed at this time. She will continue SOZO screenings. These are done every 3 months for 2 years post operatively followed by every 6 months for 2 years, and then annually.   L-DEX FLOWSHEETS - 07/06/22 0900       L-DEX LYMPHEDEMA SCREENING   Measurement Type Unilateral    L-DEX MEASUREMENT EXTREMITY Upper Extremity    POSITION  Standing    DOMINANT SIDE Right    At Risk Side Right    BASELINE SCORE (UNILATERAL) 3.4    L-DEX SCORE (UNILATERAL) 3.2    VALUE CHANGE (UNILAT) -0.2  Hermenia Bers, PTA 07/06/2022, 9:12 AM

## 2022-07-09 DIAGNOSIS — M65311 Trigger thumb, right thumb: Secondary | ICD-10-CM | POA: Diagnosis not present

## 2022-08-10 ENCOUNTER — Encounter (HOSPITAL_BASED_OUTPATIENT_CLINIC_OR_DEPARTMENT_OTHER): Payer: Medicare Other | Admitting: Family Medicine

## 2022-08-17 ENCOUNTER — Ambulatory Visit
Admission: RE | Admit: 2022-08-17 | Discharge: 2022-08-17 | Disposition: A | Discharge status: Home / Self Care | Payer: Medicare Other | Source: Ambulatory Visit | Attending: Hematology and Oncology | Admitting: Hematology and Oncology

## 2022-08-17 DIAGNOSIS — C50411 Malignant neoplasm of upper-outer quadrant of right female breast: Secondary | ICD-10-CM

## 2022-08-17 DIAGNOSIS — Z853 Personal history of malignant neoplasm of breast: Secondary | ICD-10-CM | POA: Diagnosis not present

## 2022-08-24 DIAGNOSIS — K080 Exfoliation of teeth due to systemic causes: Secondary | ICD-10-CM | POA: Diagnosis not present

## 2022-09-21 ENCOUNTER — Ambulatory Visit: Payer: Medicare Other | Attending: Hematology and Oncology

## 2022-09-21 VITALS — Wt 172.2 lb

## 2022-09-21 DIAGNOSIS — Z483 Aftercare following surgery for neoplasm: Secondary | ICD-10-CM | POA: Insufficient documentation

## 2022-09-21 NOTE — Therapy (Signed)
OUTPATIENT PHYSICAL THERAPY SOZO SCREENING NOTE   Patient Name: Judy Donovan MRN: [id number] DOB:1950/02/18, 72 y.o., female Today's Date: 09/21/2022  PCP: Farris Has, MD REFERRING PROVIDER: Serena Croissant, MD   PT End of Session - 09/21/22 [phone]     Visit Number 2   # unchanged due to screen only   PT Start Time 0929    PT Stop Time 0933    PT Time Calculation (min) 4 min    Activity Tolerance Patient tolerated treatment well    Behavior During Therapy [facility] for tasks assessed/performed             Past Medical History:  Diagnosis Date   Cataracts, bilateral    GERD (gastroesophageal reflux disease)    History of uterine fibroid    Internal and external hemorrhoids without complication    Osteoporosis    PONV (postoperative nausea and vomiting)    Past Surgical History:  Procedure Laterality Date   BREAST BIOPSY Right 08/21/2021   BREAST LUMPECTOMY Right 09/26/2021   BREAST LUMPECTOMY WITH RADIOACTIVE SEED AND SENTINEL LYMPH NODE BIOPSY Right 09/26/2021   Procedure: RIGHT BREAST RADIOACTIVE SEED LOCALIZED LUMPECTOMY AND SENTINEL NODE BIOPSY;  Surgeon: Griselda Miner, MD;  Location: [facility];  Service: General;  Laterality: Right;   CATARACT EXTRACTION W/ INTRAOCULAR LENS  IMPLANT, BILATERAL  2015   HEMORRHOID SURGERY N/A 11/19/2015   Procedure: HEMORRHOIDECTOMY;  Surgeon: Romie Levee, MD;  Location: [facility] [place] [place];  Service: General;  Laterality: N/A;   TUBAL LIGATION Bilateral 1980's   VAGINAL HYSTERECTOMY  05/12/2004   w/  Bilateral Salpingoophorectomy   Patient Active Problem List   Diagnosis Date Noted   Sleep disturbance 09/04/2021   Malignant neoplasm of upper-outer quadrant of right breast in female, estrogen receptor positive (HCC) 08/25/2021   Wellness examination 08/06/2021   Hyperlipidemia 06/10/2021   Osteoporosis 11/01/2015   Cataracts, bilateral 11/01/2015    REFERRING DIAG: right breast cancer at risk for  lymphedema  THERAPY DIAG: Aftercare following surgery for neoplasm  PERTINENT HISTORY: Screening mammogram on 08/13/2021 detected 72 year old female recalled from screening mammogram dated 08/13/2021 for right breast calcifications. 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 She had a right lumpectomy with SLNB on 09/26/2021 with 0/2 LN. Plan is  antiestrogen therapy  PRECAUTIONS: right UE Lymphedema risk, None  SUBJECTIVE: Pt returns for her 3 month L-Dex screen.   PAIN:  Are you having pain? No  SOZO SCREENING: Patient was assessed today using the SOZO machine to determine the lymphedema index score. This was compared to her baseline score. It was determined that she is within the recommended range when compared to her baseline and no further action is needed at this time. She will continue SOZO screenings. These are done every 3 months for 2 years post operatively followed by every 6 months for 2 years, and then annually.   L-DEX FLOWSHEETS - 09/21/22 0900       L-DEX LYMPHEDEMA SCREENING   Measurement Type Unilateral    L-DEX MEASUREMENT EXTREMITY Upper Extremity    POSITION  Standing    DOMINANT SIDE Right    At Risk Side Right    BASELINE SCORE (UNILATERAL) 3.4    L-DEX SCORE (UNILATERAL) 2.8    VALUE CHANGE (UNILAT) -0.6  Hermenia Bers, PTA 09/21/2022, 9:33 AM

## 2022-10-01 DIAGNOSIS — Z170 Estrogen receptor positive status [ER+]: Secondary | ICD-10-CM | POA: Diagnosis not present

## 2022-10-01 DIAGNOSIS — C50411 Malignant neoplasm of upper-outer quadrant of right female breast: Secondary | ICD-10-CM | POA: Diagnosis not present

## 2022-10-09 DIAGNOSIS — Z13220 Encounter for screening for lipoid disorders: Secondary | ICD-10-CM | POA: Diagnosis not present

## 2022-10-09 DIAGNOSIS — R7309 Other abnormal glucose: Secondary | ICD-10-CM | POA: Diagnosis not present

## 2022-10-09 DIAGNOSIS — Z0000 Encounter for general adult medical examination without abnormal findings: Secondary | ICD-10-CM | POA: Diagnosis not present

## 2022-10-09 DIAGNOSIS — Z853 Personal history of malignant neoplasm of breast: Secondary | ICD-10-CM | POA: Diagnosis not present

## 2022-10-09 DIAGNOSIS — Z1211 Encounter for screening for malignant neoplasm of colon: Secondary | ICD-10-CM | POA: Diagnosis not present

## 2022-10-12 DIAGNOSIS — Z1211 Encounter for screening for malignant neoplasm of colon: Secondary | ICD-10-CM | POA: Diagnosis not present

## 2022-11-12 DIAGNOSIS — M65311 Trigger thumb, right thumb: Secondary | ICD-10-CM | POA: Diagnosis not present

## 2022-11-26 DIAGNOSIS — M65311 Trigger thumb, right thumb: Secondary | ICD-10-CM | POA: Diagnosis not present

## 2022-12-10 DIAGNOSIS — M65311 Trigger thumb, right thumb: Secondary | ICD-10-CM | POA: Diagnosis not present

## 2022-12-10 DIAGNOSIS — R2231 Localized swelling, mass and lump, right upper limb: Secondary | ICD-10-CM | POA: Diagnosis not present

## 2022-12-16 DIAGNOSIS — R2231 Localized swelling, mass and lump, right upper limb: Secondary | ICD-10-CM | POA: Diagnosis not present

## 2022-12-16 DIAGNOSIS — D1721 Benign lipomatous neoplasm of skin and subcutaneous tissue of right arm: Secondary | ICD-10-CM | POA: Diagnosis not present

## 2022-12-28 ENCOUNTER — Ambulatory Visit: Payer: Medicare Other | Attending: Hematology and Oncology

## 2022-12-28 VITALS — Wt 168.1 lb

## 2022-12-28 DIAGNOSIS — Z483 Aftercare following surgery for neoplasm: Secondary | ICD-10-CM | POA: Insufficient documentation

## 2022-12-28 NOTE — Therapy (Signed)
OUTPATIENT PHYSICAL THERAPY SOZO SCREENING NOTE   Patient Name: Judy Donovan MRN: [id number] DOB:1950-10-17, 72 y.o., female Today's Date: 12/28/2022  PCP: Farris Has, MD REFERRING PROVIDER: Serena Croissant, MD   PT End of Session - 12/28/22 [phone]     Visit Number 2   # unchanged due to screen only   PT Start Time 0935    PT Stop Time 0939    PT Time Calculation (min) 4 min    Activity Tolerance Patient tolerated treatment well    Behavior During Therapy [facility] for tasks assessed/performed             Past Medical History:  Diagnosis Date   Cataracts, bilateral    GERD (gastroesophageal reflux disease)    History of uterine fibroid    Internal and external hemorrhoids without complication    Osteoporosis    PONV (postoperative nausea and vomiting)    Past Surgical History:  Procedure Laterality Date   BREAST BIOPSY Right 08/21/2021   BREAST LUMPECTOMY Right 09/26/2021   BREAST LUMPECTOMY WITH RADIOACTIVE SEED AND SENTINEL LYMPH NODE BIOPSY Right 09/26/2021   Procedure: RIGHT BREAST RADIOACTIVE SEED LOCALIZED LUMPECTOMY AND SENTINEL NODE BIOPSY;  Surgeon: Griselda Miner, MD;  Location: [facility];  Service: General;  Laterality: Right;   CATARACT EXTRACTION W/ INTRAOCULAR LENS  IMPLANT, BILATERAL  2015   HEMORRHOID SURGERY N/A 11/19/2015   Procedure: HEMORRHOIDECTOMY;  Surgeon: Romie Levee, MD;  Location: [facility];  Service: General;  Laterality: N/A;   TUBAL LIGATION Bilateral 1980's   VAGINAL HYSTERECTOMY  05/12/2004   w/  Bilateral Salpingoophorectomy   Patient Active Problem List   Diagnosis Date Noted   Sleep disturbance 09/04/2021   Malignant neoplasm of upper-outer quadrant of right breast in female, estrogen receptor positive (HCC) 08/25/2021   Wellness examination 08/06/2021   Hyperlipidemia 06/10/2021   Osteoporosis 11/01/2015   Cataracts, bilateral 11/01/2015    REFERRING DIAG: right breast cancer at risk for  lymphedema  THERAPY DIAG: Aftercare following surgery for neoplasm  PERTINENT HISTORY: Screening mammogram on 08/13/2021 detected 72 year old female recalled from screening mammogram dated 08/13/2021 for right breast calcifications. 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 She had a right lumpectomy with SLNB on 09/26/2021 with 0/2 LN. Plan is  antiestrogen therapy  PRECAUTIONS: right UE Lymphedema risk, None  SUBJECTIVE: Pt returns for her 3 month L-Dex screen.   PAIN:  Are you having pain? No  SOZO SCREENING: Patient was assessed today using the SOZO machine to determine the lymphedema index score. This was compared to her baseline score. It was determined that she is within the recommended range when compared to her baseline and no further action is needed at this time. She will continue SOZO screenings. These are done every 3 months for 2 years post operatively followed by every 6 months for 2 years, and then annually.   L-DEX FLOWSHEETS - 12/28/22 0900       L-DEX LYMPHEDEMA SCREENING   Measurement Type Unilateral    L-DEX MEASUREMENT EXTREMITY Upper Extremity    POSITION  Standing    DOMINANT SIDE Right    At Risk Side Right    BASELINE SCORE (UNILATERAL) 3.4    L-DEX SCORE (UNILATERAL) 5.5    VALUE CHANGE (UNILAT) 2.1  Hermenia Bers, PTA 12/28/2022, 9:38 AM

## 2022-12-31 DIAGNOSIS — R2231 Localized swelling, mass and lump, right upper limb: Secondary | ICD-10-CM | POA: Diagnosis not present

## 2023-02-15 DIAGNOSIS — D1721 Benign lipomatous neoplasm of skin and subcutaneous tissue of right arm: Secondary | ICD-10-CM | POA: Diagnosis not present

## 2023-02-23 ENCOUNTER — Encounter: Payer: Self-pay | Admitting: Hematology and Oncology

## 2023-02-23 ENCOUNTER — Other Ambulatory Visit: Payer: Self-pay | Admitting: Hematology and Oncology

## 2023-02-23 DIAGNOSIS — Z9889 Other specified postprocedural states: Secondary | ICD-10-CM

## 2023-02-24 ENCOUNTER — Other Ambulatory Visit: Payer: Self-pay | Admitting: Hematology and Oncology

## 2023-02-24 DIAGNOSIS — Z9889 Other specified postprocedural states: Secondary | ICD-10-CM

## 2023-03-01 DIAGNOSIS — K080 Exfoliation of teeth due to systemic causes: Secondary | ICD-10-CM | POA: Diagnosis not present

## 2023-03-04 DIAGNOSIS — R2231 Localized swelling, mass and lump, right upper limb: Secondary | ICD-10-CM | POA: Diagnosis not present

## 2023-03-08 ENCOUNTER — Inpatient Hospital Stay: Payer: Medicare Other | Attending: Hematology and Oncology | Admitting: Hematology and Oncology

## 2023-03-08 VITALS — BP 161/71 | HR 91 | Temp 97.6°F | Resp 18 | Ht 65.0 in | Wt 165.7 lb

## 2023-03-08 DIAGNOSIS — Z1732 Human epidermal growth factor receptor 2 negative status: Secondary | ICD-10-CM | POA: Diagnosis not present

## 2023-03-08 DIAGNOSIS — Z170 Estrogen receptor positive status [ER+]: Secondary | ICD-10-CM

## 2023-03-08 DIAGNOSIS — C50411 Malignant neoplasm of upper-outer quadrant of right female breast: Secondary | ICD-10-CM

## 2023-03-08 DIAGNOSIS — M810 Age-related osteoporosis without current pathological fracture: Secondary | ICD-10-CM | POA: Insufficient documentation

## 2023-03-08 DIAGNOSIS — Z923 Personal history of irradiation: Secondary | ICD-10-CM | POA: Diagnosis not present

## 2023-03-08 NOTE — Progress Notes (Signed)
Patient Care Team: Farris Has, MD as PCP - General (Family Medicine) Pershing Proud, RN as Oncology Nurse Navigator Donnelly Angelica, RN as Oncology Nurse Navigator  DIAGNOSIS:  Encounter Diagnosis  Name Primary?   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  0/2 lymph nodes negative (based on the biopsy tumor size is 0.3 cm)     CHIEF COMPLIANT: Surveillance of breast cancer  HISTORY OF PRESENT ILLNESS:   History of Present Illness   The patient, with a history of trigger finger and h/o breast cancer, presents for a routine check-up following recent surgical interventions. She reports having undergone surgery three weeks ago to remove three large knots of fatty tissue and to address a painful trigger finger that was inhibiting her ability to paint, a beloved hobby. The patient notes that her hand movement is gradually improving, although she initially struggled with tasks such as putting in earrings. She expresses satisfaction with the healing process and the progress she's made in regaining hand function.  In addition to her surgical recovery, the patient discusses her active lifestyle, which includes regular walking when weather permits and staying busy with household chores. She also mentions her passion for painting, which serves as a form of mental therapy for her. The patient expresses a positive outlook on her health and a proactive approach to managing minor health  issues independently.         ALLERGIES:  is allergic to prolia [denosumab] and amoxicillin.  MEDICATIONS:  Current Outpatient Medications  Medication Sig Dispense Refill   aspirin 81 MG tablet Take 81 mg by mouth daily.     cetirizine (ZYRTEC) 5 MG tablet Take 5 mg by mouth as needed.      Lansoprazole (PREVACID PO) Take by mouth as needed.     MAGNESIUM PO Take 350 mg by mouth at bedtime.     Omega-3 Fatty Acids (FISH OIL PO) Take by mouth daily. 1 teaspoon in pm (Patient not taking: Reported on 03/06/2022)     Probiotic Product (ALIGN PO) Take 1 capsule by mouth daily.     No current facility-administered medications for this visit.    PHYSICAL EXAMINATION: ECOG PERFORMANCE STATUS: 1 - Symptomatic but completely ambulatory  Vitals:   03/08/23 1023  BP: (!) 161/71  Pulse: 91  Resp: 18  Temp: 97.6 F (36.4 C)  SpO2: 98%   Filed Weights   03/08/23 1023  Weight: 165 lb 11.2 oz (75.2 kg)    Physical Exam   BREAST: Scar tissue looks normal and feels normal. No palpable lymph nodes. MUSCULOSKELETAL: Scar from surgery on arm, healing well.      (exam performed in the presence of a chaperone)  LABORATORY DATA:  I have reviewed the data as listed    Latest Ref Rng & Units 08/27/2021   12:33 PM 07/28/2021    8:40 AM 04/23/2015   11:22 AM  CMP  Glucose 70 - 99 mg/dL 84  97    BUN 8 - 23 mg/dL  10  12    Creatinine 0.44 - 1.00 mg/dL 4.09  8.11    Sodium 914 - 145 mmol/L 142  141    Potassium 3.5 - 5.1 mmol/L 3.8  4.0    Chloride 98 - 111 mmol/L 107  103    CO2 22 - 32 mmol/L 28  24    Calcium 8.9 - 10.3 mg/dL 9.4  9.2  9.2   Total Protein 6.5 - 8.1 g/dL 7.6  7.0    Total Bilirubin 0.3 - 1.2 mg/dL 0.4  0.3    Alkaline Phos 38 - 126 U/L 73  94    AST 15 - 41 U/L 25  25    ALT 0 - 44 U/L 26  23      Lab Results  Component Value Date   WBC 5.6 08/27/2021   HGB 14.1 08/27/2021   HCT 41.1 08/27/2021   MCV 88.0 08/27/2021   PLT 207 08/27/2021   NEUTROABS 3.2  08/27/2021    ASSESSMENT & PLAN:  Malignant neoplasm of upper-outer quadrant of right breast in female, estrogen receptor positive (HCC) 08/21/2021:Screening mammogram detected right breast calcifications 6 mm group UOQ stereotactic biopsy revealed 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  0/2 lymph nodes negative (based on the biopsy tumor size is 0.3 cm)   Treatment plan: 1.  Adjuvant radiation therapy (based upon her age and favorable surgical results the decision was made to omit radiation) 2. adjuvant antiestrogen therapy started November 2023-discontinued 02/20/2022 due to rash   Anastrozole toxicities: Rash on the chin and neck which led to discontinuation of anastrozole therapy.  Patient does not want to take any further antiestrogen therapy.  Osteoporosis: Bone density showed a T-score of -2.6: Patient will discuss with her PCP about bisphosphonate therapy.  Breast cancer surveillance: Breast exam 03/08/2023: Benign Mammogram scheduled for 08/18/2023  Return to clinic in 1 year for follow-up ------------------------------------- Assessment and Plan    Post-surgical recovery Recent surgery for removal of fatty tissue and trigger finger repair. Healing well with improving hand function. No complications reported. -Continue current care and follow-up with surgeon as needed.  Breast Cancer Follow-up No new symptoms or concerns. Mammogram scheduled for July. -Continue annual mammograms and clinical breast exams. -Next follow-up in one year.  General Health Maintenance Regular physical activity, including walking and house chores. -Encouraged to continue current level of physical activity.          No orders of the defined types were placed in this encounter.  The patient has a good understanding of the overall plan. she agrees with it. she will call with any  problems that may develop before the next visit here. Total time spent: 30 mins including face to face time and time spent for planning, charting and co-ordination of care   Tamsen Meek, MD 03/08/23

## 2023-03-08 NOTE — Assessment & Plan Note (Signed)
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  0/2 lymph nodes negative (based on the biopsy tumor size is 0.3 cm)   Treatment plan: 1.  Adjuvant radiation therapy (based upon her age and favorable surgical results the decision was made to omit radiation) 2. adjuvant antiestrogen therapy started November 2023-discontinued 02/20/2022 due to rash   Anastrozole toxicities: Rash on the chin and neck which led to discontinuation of anastrozole therapy.  Patient does not want to take any further antiestrogen therapy.  Osteoporosis: Bone density showed a T-score of -2.6: Patient will discuss with her PCP about bisphosphonate therapy.  Breast cancer surveillance: Breast exam 03/08/2023: Benign Mammogram scheduled for 08/18/2023  Return to clinic in 1 year for follow-up

## 2023-03-29 ENCOUNTER — Ambulatory Visit: Payer: Medicare Other | Attending: Hematology and Oncology

## 2023-03-29 VITALS — Wt 168.2 lb

## 2023-03-29 DIAGNOSIS — Z483 Aftercare following surgery for neoplasm: Secondary | ICD-10-CM

## 2023-03-29 NOTE — Therapy (Signed)
 OUTPATIENT PHYSICAL THERAPY SOZO SCREENING NOTE   Patient Name: Judy Donovan MRN: [id number] DOB:08-Aug-1950, 73 y.o., female Today's Date: 03/29/2023  PCP: Ronna Coho, MD REFERRING PROVIDER: Cameron Cea, MD   PT End of Session - 03/29/23 9528     Visit Number 2   # unchanged due to screen only   PT Start Time 0922    PT Stop Time 0926    PT Time Calculation (min) 4 min    Activity Tolerance Patient tolerated treatment well    Behavior During Therapy [facility] for tasks assessed/performed             Past Medical History:  Diagnosis Date   Cataracts, bilateral    GERD (gastroesophageal reflux disease)    History of uterine fibroid    Internal and external hemorrhoids without complication    Osteoporosis    PONV (postoperative nausea and vomiting)    Past Surgical History:  Procedure Laterality Date   BREAST BIOPSY Right 08/21/2021   BREAST LUMPECTOMY Right 09/26/2021   BREAST LUMPECTOMY WITH RADIOACTIVE SEED AND SENTINEL LYMPH NODE BIOPSY Right 09/26/2021   Procedure: RIGHT BREAST RADIOACTIVE SEED LOCALIZED LUMPECTOMY AND SENTINEL NODE BIOPSY;  Surgeon: Caralyn Chandler, MD;  Location: [facility];  Service: General;  Laterality: Right;   CATARACT EXTRACTION W/ INTRAOCULAR LENS  IMPLANT, BILATERAL  2015   HEMORRHOID SURGERY N/A 11/19/2015   Procedure: HEMORRHOIDECTOMY;  Surgeon: Joyce Nixon, MD;  Location: [facility];  Service: General;  Laterality: N/A;   TUBAL LIGATION Bilateral 1980's   VAGINAL HYSTERECTOMY  05/12/2004   w/  Bilateral Salpingoophorectomy   Patient Active Problem List   Diagnosis Date Noted   Sleep disturbance 09/04/2021   Malignant neoplasm of upper-outer quadrant of right breast in female, estrogen receptor positive (HCC) 08/25/2021   Wellness examination 08/06/2021   Hyperlipidemia 06/10/2021   Osteoporosis 11/01/2015   Cataracts, bilateral 11/01/2015    REFERRING DIAG: right breast cancer at risk for  lymphedema  THERAPY DIAG: Aftercare following surgery for neoplasm  PERTINENT HISTORY: Screening mammogram on 08/13/2021 detected 73 year old female recalled from screening mammogram dated 08/13/2021 for right breast calcifications. 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 She had a right lumpectomy with SLNB on 09/26/2021 with 0/2 LN. Plan is  antiestrogen therapy  PRECAUTIONS: right UE Lymphedema risk, None  SUBJECTIVE: Pt returns for her 3 month L-Dex screen. "I had surgery to remove knots from my Rt arm and there was some nerve damge so I've been doing the exercises I was given to try to get all the motion back in my fingers."  PAIN:  Are you having pain? No  SOZO SCREENING: Patient was assessed today using the SOZO machine to determine the lymphedema index score. This was compared to her baseline score. It was determined that she is within the recommended range when compared to her baseline and no further action is needed at this time. She will continue SOZO screenings. These are done every 3 months for 2 years post operatively followed by every 6 months for 2 years, and then annually.   L-DEX FLOWSHEETS - 03/29/23 0900       L-DEX LYMPHEDEMA SCREENING   Measurement Type Unilateral    L-DEX MEASUREMENT EXTREMITY Upper Extremity    POSITION  Standing    DOMINANT SIDE Right  At Risk Side Right    BASELINE SCORE (UNILATERAL) 3.4    L-DEX SCORE (UNILATERAL) 7.8    VALUE CHANGE (UNILAT) 4.4             P: Probable to begin 6 month after next screen.   Denyce Flank, PTA 03/29/2023, 9:25 AM

## 2023-04-15 DIAGNOSIS — R2231 Localized swelling, mass and lump, right upper limb: Secondary | ICD-10-CM | POA: Diagnosis not present

## 2023-05-12 DIAGNOSIS — C50911 Malignant neoplasm of unspecified site of right female breast: Secondary | ICD-10-CM | POA: Diagnosis not present

## 2023-07-12 DIAGNOSIS — R1032 Left lower quadrant pain: Secondary | ICD-10-CM | POA: Diagnosis not present

## 2023-07-12 DIAGNOSIS — K5732 Diverticulitis of large intestine without perforation or abscess without bleeding: Secondary | ICD-10-CM | POA: Diagnosis not present

## 2023-07-13 ENCOUNTER — Ambulatory Visit: Payer: Medicare Other | Attending: Hematology and Oncology

## 2023-07-13 VITALS — Wt 160.2 lb

## 2023-07-13 DIAGNOSIS — Z483 Aftercare following surgery for neoplasm: Secondary | ICD-10-CM | POA: Insufficient documentation

## 2023-07-13 NOTE — Therapy (Signed)
 OUTPATIENT PHYSICAL THERAPY SOZO SCREENING NOTE   Patient Name: Judy Donovan MRN: [id number] DOB:Apr 24, 1950, 72 y.o., female Today's Date: 07/13/2023  PCP: Ronna Coho, MD REFERRING PROVIDER: Cameron Cea, MD   PT End of Session - 07/13/23 0932     Visit Number 2   # unchanged due to screen only   PT Start Time 0930    PT Stop Time 0934    PT Time Calculation (min) 4 min    Activity Tolerance Patient tolerated treatment well    Behavior During Therapy [facility] for tasks assessed/performed             Past Medical History:  Diagnosis Date   Cataracts, bilateral    GERD (gastroesophageal reflux disease)    History of uterine fibroid    Internal and external hemorrhoids without complication    Osteoporosis    PONV (postoperative nausea and vomiting)    Past Surgical History:  Procedure Laterality Date   BREAST BIOPSY Right 08/21/2021   BREAST LUMPECTOMY Right 09/26/2021   BREAST LUMPECTOMY WITH RADIOACTIVE SEED AND SENTINEL LYMPH NODE BIOPSY Right 09/26/2021   Procedure: RIGHT BREAST RADIOACTIVE SEED LOCALIZED LUMPECTOMY AND SENTINEL NODE BIOPSY;  Surgeon: Caralyn Chandler, MD;  Location: [facility];  Service: General;  Laterality: Right;   CATARACT EXTRACTION W/ INTRAOCULAR LENS  IMPLANT, BILATERAL  2015   HEMORRHOID SURGERY N/A 11/19/2015   Procedure: HEMORRHOIDECTOMY;  Surgeon: Joyce Nixon, MD;  Location: [facility];  Service: General;  Laterality: N/A;   TUBAL LIGATION Bilateral 1980's   VAGINAL HYSTERECTOMY  05/12/2004   w/  Bilateral Salpingoophorectomy   Patient Active Problem List   Diagnosis Date Noted   Sleep disturbance 09/04/2021   Malignant neoplasm of upper-outer quadrant of right breast in female, estrogen receptor positive (HCC) 08/25/2021   Wellness examination 08/06/2021   Hyperlipidemia 06/10/2021   Osteoporosis 11/01/2015   Cataracts, bilateral 11/01/2015    REFERRING DIAG: right breast cancer at risk for  lymphedema  THERAPY DIAG: Aftercare following surgery for neoplasm  PERTINENT HISTORY: Screening mammogram on 08/13/2021 detected 73 year old female recalled from screening mammogram dated 08/13/2021 for right breast calcifications. 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 She had a right lumpectomy with SLNB on 09/26/2021 with 0/2 LN. Plan is  antiestrogen therapy  PRECAUTIONS: right UE Lymphedema risk, None  SUBJECTIVE: Pt returns for her 3 month L-Dex screen.   PAIN:  Are you having pain? No  SOZO SCREENING: Patient was assessed today using the SOZO machine to determine the lymphedema index score. This was compared to her baseline score. It was determined that she is within the recommended range when compared to her baseline and no further action is needed at this time. She will continue SOZO screenings. These are done every 3 months for 2 years post operatively followed by every 6 months for 2 years, and then annually.   L-DEX FLOWSHEETS - 07/13/23 0900       L-DEX LYMPHEDEMA SCREENING   Measurement Type Unilateral    L-DEX MEASUREMENT EXTREMITY Upper Extremity    POSITION  Standing    DOMINANT SIDE Right    At Risk Side Right    BASELINE SCORE (UNILATERAL) 3.4    L-DEX SCORE (UNILATERAL) 3.1    VALUE CHANGE (UNILAT) -0.3  P: Begin 6 month after next screen.   Denyce Flank, PTA 07/13/2023, 9:35 AM

## 2023-07-14 DIAGNOSIS — K5732 Diverticulitis of large intestine without perforation or abscess without bleeding: Secondary | ICD-10-CM | POA: Diagnosis not present

## 2023-07-27 DIAGNOSIS — C50911 Malignant neoplasm of unspecified site of right female breast: Secondary | ICD-10-CM | POA: Diagnosis not present

## 2023-07-30 DIAGNOSIS — F411 Generalized anxiety disorder: Secondary | ICD-10-CM | POA: Diagnosis not present

## 2023-07-30 DIAGNOSIS — R252 Cramp and spasm: Secondary | ICD-10-CM | POA: Diagnosis not present

## 2023-07-30 DIAGNOSIS — G4700 Insomnia, unspecified: Secondary | ICD-10-CM | POA: Diagnosis not present

## 2023-08-01 ENCOUNTER — Other Ambulatory Visit: Payer: Self-pay

## 2023-08-01 ENCOUNTER — Emergency Department (HOSPITAL_BASED_OUTPATIENT_CLINIC_OR_DEPARTMENT_OTHER)

## 2023-08-01 ENCOUNTER — Inpatient Hospital Stay (HOSPITAL_BASED_OUTPATIENT_CLINIC_OR_DEPARTMENT_OTHER)
Admission: EM | Admit: 2023-08-01 | Discharge: 2023-08-04 | DRG: 392 | Disposition: A | Discharge status: Home / Self Care | Source: Ambulatory Visit | Attending: Internal Medicine | Admitting: Internal Medicine

## 2023-08-01 DIAGNOSIS — Z801 Family history of malignant neoplasm of trachea, bronchus and lung: Secondary | ICD-10-CM

## 2023-08-01 DIAGNOSIS — K219 Gastro-esophageal reflux disease without esophagitis: Secondary | ICD-10-CM | POA: Diagnosis present

## 2023-08-01 DIAGNOSIS — Z853 Personal history of malignant neoplasm of breast: Secondary | ICD-10-CM | POA: Diagnosis not present

## 2023-08-01 DIAGNOSIS — K5792 Diverticulitis of intestine, part unspecified, without perforation or abscess without bleeding: Principal | ICD-10-CM

## 2023-08-01 DIAGNOSIS — K5780 Diverticulitis of intestine, part unspecified, with perforation and abscess without bleeding: Secondary | ICD-10-CM | POA: Diagnosis present

## 2023-08-01 DIAGNOSIS — Z87891 Personal history of nicotine dependence: Secondary | ICD-10-CM

## 2023-08-01 DIAGNOSIS — Z7982 Long term (current) use of aspirin: Secondary | ICD-10-CM

## 2023-08-01 DIAGNOSIS — R030 Elevated blood-pressure reading, without diagnosis of hypertension: Secondary | ICD-10-CM | POA: Diagnosis present

## 2023-08-01 DIAGNOSIS — Z170 Estrogen receptor positive status [ER+]: Secondary | ICD-10-CM | POA: Diagnosis not present

## 2023-08-01 DIAGNOSIS — Z888 Allergy status to other drugs, medicaments and biological substances status: Secondary | ICD-10-CM | POA: Diagnosis not present

## 2023-08-01 DIAGNOSIS — K5720 Diverticulitis of large intestine with perforation and abscess without bleeding: Secondary | ICD-10-CM | POA: Diagnosis not present

## 2023-08-01 DIAGNOSIS — K5732 Diverticulitis of large intestine without perforation or abscess without bleeding: Secondary | ICD-10-CM | POA: Diagnosis not present

## 2023-08-01 DIAGNOSIS — Z880 Allergy status to penicillin: Secondary | ICD-10-CM

## 2023-08-01 DIAGNOSIS — E785 Hyperlipidemia, unspecified: Secondary | ICD-10-CM | POA: Diagnosis present

## 2023-08-01 DIAGNOSIS — Z90710 Acquired absence of both cervix and uterus: Secondary | ICD-10-CM

## 2023-08-01 DIAGNOSIS — C50411 Malignant neoplasm of upper-outer quadrant of right female breast: Secondary | ICD-10-CM

## 2023-08-01 DIAGNOSIS — Z8249 Family history of ischemic heart disease and other diseases of the circulatory system: Secondary | ICD-10-CM | POA: Diagnosis not present

## 2023-08-01 DIAGNOSIS — R1032 Left lower quadrant pain: Secondary | ICD-10-CM | POA: Diagnosis not present

## 2023-08-01 DIAGNOSIS — K8689 Other specified diseases of pancreas: Secondary | ICD-10-CM | POA: Diagnosis present

## 2023-08-01 DIAGNOSIS — K5700 Diverticulitis of small intestine with perforation and abscess without bleeding: Secondary | ICD-10-CM | POA: Diagnosis not present

## 2023-08-01 DIAGNOSIS — K5900 Constipation, unspecified: Secondary | ICD-10-CM | POA: Diagnosis present

## 2023-08-01 DIAGNOSIS — R109 Unspecified abdominal pain: Secondary | ICD-10-CM | POA: Diagnosis not present

## 2023-08-01 DIAGNOSIS — K449 Diaphragmatic hernia without obstruction or gangrene: Secondary | ICD-10-CM | POA: Diagnosis not present

## 2023-08-01 LAB — COMPREHENSIVE METABOLIC PANEL WITH GFR
ALT: 15 U/L (ref 0–44)
AST: 24 U/L (ref 15–41)
Albumin: 3.9 g/dL (ref 3.5–5.0)
Alkaline Phosphatase: 105 U/L (ref 38–126)
Anion gap: 14 (ref 5–15)
BUN: 9 mg/dL (ref 8–23)
CO2: 24 mmol/L (ref 22–32)
Calcium: 9.5 mg/dL (ref 8.9–10.3)
Chloride: 97 mmol/L — ABNORMAL LOW (ref 98–111)
Creatinine, Ser: 0.67 mg/dL (ref 0.44–1.00)
GFR, Estimated: >60 mL/min (ref >60)
Glucose, Bld: 106 mg/dL — ABNORMAL HIGH (ref 70–99)
Potassium: 3.9 mmol/L (ref 3.5–5.1)
Sodium: 135 mmol/L (ref 135–145)
Total Bilirubin: 0.3 mg/dL (ref 0.0–1.2)
Total Protein: 8 g/dL (ref 6.5–8.1)

## 2023-08-01 LAB — LACTIC ACID, PLASMA
Lactic Acid, Venous: 1.2 mmol/L (ref 0.5–1.9)
Lactic Acid, Venous: 1.4 mmol/L (ref 0.5–1.9)

## 2023-08-01 LAB — URINALYSIS, ROUTINE W REFLEX MICROSCOPIC
Bacteria, UA: NONE SEEN
Bilirubin Urine: NEGATIVE
Glucose, UA: NEGATIVE mg/dL
Hgb urine dipstick: NEGATIVE
Ketones, ur: NEGATIVE mg/dL
Nitrite: NEGATIVE
Protein, ur: NEGATIVE mg/dL
Specific Gravity, Urine: 1.024 (ref 1.005–1.030)
pH: 5.5 (ref 5.0–8.0)

## 2023-08-01 LAB — CBC
HCT: 39.1 % (ref 36.0–46.0)
Hemoglobin: 13.2 g/dL (ref 12.0–15.0)
MCH: 28.6 pg (ref 26.0–34.0)
MCHC: 33.8 g/dL (ref 30.0–36.0)
MCV: 84.6 fL (ref 80.0–100.0)
Platelets: 266 10*3/uL (ref 150–400)
RBC: 4.62 MIL/uL (ref 3.87–5.11)
RDW: 11.5 % (ref 11.5–15.5)
WBC: 12 10*3/uL — ABNORMAL HIGH (ref 4.0–10.5)
nRBC: 0 % (ref 0.0–0.2)

## 2023-08-01 LAB — LIPASE, BLOOD: Lipase: 53 U/L — ABNORMAL HIGH (ref 11–51)

## 2023-08-01 MED ORDER — MORPHINE SULFATE (PF) 4 MG/ML IV SOLN
4.0000 mg | Freq: Once | INTRAVENOUS | Status: AC
  Administered 2023-08-01: 4 mg via INTRAVENOUS
  Filled 2023-08-01: qty 1

## 2023-08-01 MED ORDER — HYDROMORPHONE HCL 1 MG/ML IJ SOLN
0.5000 mg | INTRAMUSCULAR | Status: DC | PRN
  Administered 2023-08-01 – 2023-08-03 (×2): 0.5 mg via INTRAVENOUS
  Filled 2023-08-01 (×3): qty 0.5

## 2023-08-01 MED ORDER — PANTOPRAZOLE SODIUM 40 MG IV SOLR
40.0000 mg | INTRAVENOUS | Status: DC
  Administered 2023-08-01 – 2023-08-03 (×3): 40 mg via INTRAVENOUS
  Filled 2023-08-01 (×3): qty 10

## 2023-08-01 MED ORDER — ONDANSETRON HCL 4 MG/2ML IJ SOLN
4.0000 mg | Freq: Once | INTRAMUSCULAR | Status: AC
  Administered 2023-08-01: 4 mg via INTRAVENOUS
  Filled 2023-08-01: qty 2

## 2023-08-01 MED ORDER — IOHEXOL 300 MG/ML  SOLN
100.0000 mL | Freq: Once | INTRAMUSCULAR | Status: AC | PRN
  Administered 2023-08-01: 80 mL via INTRAVENOUS

## 2023-08-01 MED ORDER — ONDANSETRON HCL 4 MG/2ML IJ SOLN: 4.0000 mg | Freq: Four times a day (QID) | INTRAMUSCULAR | Status: DC | PRN

## 2023-08-01 MED ORDER — METRONIDAZOLE 500 MG/100ML IV SOLN
500.0000 mg | Freq: Once | INTRAVENOUS | Status: AC
Start: 2023-08-01 — End: 2023-08-01
  Administered 2023-08-01: 500 mg via INTRAVENOUS
  Filled 2023-08-01: qty 100

## 2023-08-01 MED ORDER — ONDANSETRON HCL 4 MG PO TABS: 4.0000 mg | ORAL_TABLET | Freq: Four times a day (QID) | ORAL | Status: DC | PRN

## 2023-08-01 MED ORDER — SODIUM CHLORIDE 0.9 % IV SOLN
2.0000 g | Freq: Once | INTRAVENOUS | Status: AC
  Administered 2023-08-01: 2 g via INTRAVENOUS
  Filled 2023-08-01: qty 20

## 2023-08-01 MED ORDER — SODIUM CHLORIDE 0.9 % IV SOLN: 1.0000 g | INTRAVENOUS | Status: DC

## 2023-08-01 MED ORDER — DEXTROSE IN LACTATED RINGERS 5 % IV SOLN: INTRAVENOUS | Status: AC

## 2023-08-01 MED ORDER — METRONIDAZOLE 500 MG/100ML IV SOLN
500.0000 mg | Freq: Two times a day (BID) | INTRAVENOUS | Status: DC
  Administered 2023-08-02: 500 mg via INTRAVENOUS
  Filled 2023-08-01 (×2): qty 100

## 2023-08-01 MED ORDER — SODIUM CHLORIDE 0.9 % IV SOLN: 2.0000 g | INTRAVENOUS | Status: DC

## 2023-08-01 MED ORDER — METRONIDAZOLE 500 MG/100ML IV SOLN
500.0000 mg | Freq: Two times a day (BID) | INTRAVENOUS | Status: DC
  Administered 2023-08-01: 500 mg via INTRAVENOUS

## 2023-08-01 NOTE — Progress Notes (Addendum)
 Plan of Care Note for accepted transfer   Patient: Judy Donovan MRN: [id number]   DOA: 08/01/2023  Facility [facility] transfer: [facility] Requesting Provider: Arnita Bible, PA  Reason for transfer: diverticulitis with abscess formation and microperforation  Facility course: 73 year old female with history of breast cancer, HLD, osteoporosis who presented to Ed with complaints of abdominal pain. Recently treated with antibiotics for diverticulitis and abdominal pain returned.  Seen by PCP on 5/26 and started on cipro and flagyl. She had antibiotics changed to augmentin  on 5/28. She felt better and then pain started to return over the past 1-2 days. She has TTP in LLQ, per EDP no rebound or guarding.   Vitals: afebrile, bp: 162/79, HR: 89>108, RR: 19, oxygen: 99%RA Pertinent labs: WBC: 12.0, lipase 53 CT abdomen/pelvis: Colonic diverticulosis with long segment mid sigmoid colon acute diverticulitis. Suggestion of a developing intramural 1.9 cm abscess formation and micro perforation. Recommend colonoscopy status post treatment and status post complete resolution of inflammatory changes to exclude an underlying lesion.  Enlarged main pancreatic duct dilatation (0.9 cm). Query underlying IPMN versus obstructive mass proximally. When the patient is clinically stable and able to follow directions and hold their breath (preferably as an outpatient) further evaluation with dedicated abdominal MRI pancreatic protocol should be considered. Tiny hiatal hernia  Aortic atherosclerosis  In ED: Started on rocephin and flagyl. [facility] asked to admit.   Plan of care: The patient is accepted for admission to progressive unit, at [facility] or [facility]. I asked EDP to consult general surgery. I have added on blood cultures and lactic acid as she has sepsis criteria with leukocytosis and tachycardia. She is NPO.    Author: Raymona Caldwell, MD 08/01/2023  Check [email or web address] for on-call coverage.  Nursing  staff, Please call [facility] Admits & Consults System-Wide number on Amion as soon as patient's arrival, so appropriate admitting provider can evaluate the pt.

## 2023-08-01 NOTE — H&P (Signed)
 History and Physical    Patient: Judy Donovan ZOX:[id number] DOB: 14-Feb-1951 DOA: 08/01/2023 DOS: the patient was seen and examined on 08/01/2023 PCP: Ronna Coho, MD  Patient coming from: {Point_of_Origin:[address]}  Chief Complaint:  Chief Complaint  Patient presents with   Abdominal Pain   HPI: [name] is a 73 y.o. female with medical history significant of ***  Review of Systems: {ROS_Text:26778} Past Medical History:  Diagnosis Date   Cataracts, bilateral    GERD (gastroesophageal reflux disease)    History of uterine fibroid    Internal and external hemorrhoids without complication    Osteoporosis    PONV (postoperative nausea and vomiting)    Past Surgical History:  Procedure Laterality Date   BREAST BIOPSY Right 08/21/2021   BREAST LUMPECTOMY Right 09/26/2021   BREAST LUMPECTOMY WITH RADIOACTIVE SEED AND SENTINEL LYMPH NODE BIOPSY Right 09/26/2021   Procedure: RIGHT BREAST RADIOACTIVE SEED LOCALIZED LUMPECTOMY AND SENTINEL NODE BIOPSY;  Surgeon: Caralyn Chandler, MD;  Location: [facility];  Service: General;  Laterality: Right;   CATARACT EXTRACTION W/ INTRAOCULAR LENS  IMPLANT, BILATERAL  2015   HEMORRHOID SURGERY N/A 11/19/2015   Procedure: HEMORRHOIDECTOMY;  Surgeon: Joyce Nixon, MD;  Location: [facility];  Service: General;  Laterality: N/A;   TUBAL LIGATION Bilateral 1980's   VAGINAL HYSTERECTOMY  05/12/2004   w/  Bilateral Salpingoophorectomy   Social History:  reports that she quit smoking about 44 years ago. Her smoking use included cigarettes. She started smoking about 49 years ago. She has never used smokeless tobacco. She reports current alcohol use of about 1.0 standard drink of alcohol per week. She reports that she does not use drugs.  Allergies  Allergen Reactions   Prolia  [Denosumab ] Other (See Comments)    Makes me feel funny, dizziness   Amoxicillin  Rash    Makes me feel funny    Family History   Problem Relation Age of Onset   Osteoporosis Mother    Hypertension Mother    Cancer Father        Lung cancer   Lung cancer Father 41    Prior to Admission medications   Medication Sig Start Date End Date Taking? Authorizing Provider  aspirin 81 MG tablet Take 81 mg by mouth daily.    [provider]  cetirizine (ZYRTEC) 5 MG tablet Take 5 mg by mouth as needed.     [provider]  Lansoprazole (PREVACID PO) Take by mouth as needed.    [provider]  MAGNESIUM PO Take 350 mg by mouth at bedtime.    [provider]  Omega-3 Fatty Acids (FISH OIL PO) Take by mouth daily. 1 teaspoon in pm Patient not taking: Reported on 03/06/2022    [provider]  Probiotic Product (ALIGN PO) Take 1 capsule by mouth daily.    [provider]    Physical Exam: Vitals:   08/01/23 1800 08/01/23 1940 08/01/23 1945 08/01/23 1950  BP:   (!) 112/51   Pulse:      Resp: 18 (!) 21 (!) 21 17  Temp: 99.5 F (37.5 C)     TempSrc: Oral     SpO2:      Weight:      Height:       *** Data Reviewed: {Tip this will not be part of the note when signed- Document your independent interpretation of telemetry tracing, EKG, lab, Radiology test or any other diagnostic tests. Add any new diagnostic test ordered  today. (Optional):26781} {Results:26384}  Assessment and Plan: No notes have been filed under this hospital service. Service: Hospitalist     Advance Care Planning:   Code Status: Not on file ***  Consults: ***  Family Communication: ***  Severity of Illness: {Observation/Inpatient:21159}  AuthorCarolin Chyle, MD 08/01/2023 7:57 PM  For on call review [email or web address].

## 2023-08-01 NOTE — ED Triage Notes (Signed)
 Pt POV from [facility] clinic reporting returning abd pain r/t diverticulitis. Recently completed abx for same, pain now returned, advised to come to ED for further evaluation.

## 2023-08-01 NOTE — Plan of Care (Signed)

## 2023-08-01 NOTE — ED Provider Notes (Signed)
 [place] EMERGENCY DEPARTMENT AT [facility] Provider Note   CSN: [id number] Arrival date & time: 08/01/23  1516     Patient presents with: Abdominal Pain   Gwynne Kemnitz is a 73 y.o. female with past medical history significant for hyperlipidemia, GERD who presents with concern for left lower quadrant abdominal pain, concern for diverticulitis.  She was treated in May for presumed diverticulitis after abdominal exam by PCP without CT scan.  She completed course of antibiotics, reports the pain had somewhat improved but was still lingering, she was feeling constipated last few days and took some laxatives.  She reports some focal left lower quadrant tenderness today, saw doctor, who sent her here to further evaluate for diverticulitis.    Abdominal Pain      Prior to Admission medications   Medication Sig Start Date End Date Taking? Authorizing Provider  aspirin 81 MG tablet Take 81 mg by mouth daily.    [provider]  cetirizine (ZYRTEC) 5 MG tablet Take 5 mg by mouth as needed.     [provider]  Lansoprazole (PREVACID PO) Take by mouth as needed.    [provider]  MAGNESIUM PO Take 350 mg by mouth at bedtime.    [provider]  Omega-3 Fatty Acids (FISH OIL PO) Take by mouth daily. 1 teaspoon in pm Patient not taking: Reported on 03/06/2022    [provider]  Probiotic Product (ALIGN PO) Take 1 capsule by mouth daily.    [provider]    Allergies: Prolia  [denosumab ] and Amoxicillin     Review of Systems  Gastrointestinal:  Positive for abdominal pain.  All other systems reviewed and are negative.   Updated Vital Signs BP (!) 122/59   Pulse (!) 108   Temp 98.1 F (36.7 C) (Oral)   Resp 16   Ht 5' 5 (1.651 m)   Wt 71.7 kg   LMP 04/16/2004   SpO2 94%   BMI 26.29 kg/m   Physical Exam Vitals and nursing note reviewed.  Constitutional:      General: She is not in acute distress.     Appearance: Normal appearance.  HENT:     Head: Normocephalic and atraumatic.   Eyes:     General:        Right eye: No discharge.        Left eye: No discharge.    Cardiovascular:     Rate and Rhythm: Normal rate and regular rhythm.     Heart sounds: No murmur heard.    No friction rub. No gallop.  Pulmonary:     Effort: Pulmonary effort is normal.     Breath sounds: Normal breath sounds.  Abdominal:     General: Bowel sounds are normal.     Palpations: Abdomen is soft.     Comments: Focal tenderness to palpation in the left lower quadrant, no rebound, rigidity, guarding   Skin:    General: Skin is warm and dry.     Capillary Refill: Capillary refill takes less than 2 seconds.   Neurological:     Mental Status: She is alert and oriented to person, place, and time.   Psychiatric:        Mood and Affect: Mood normal.        Behavior: Behavior normal.     (all labs ordered are listed, but only abnormal results are displayed) Labs Reviewed  LIPASE, BLOOD - Abnormal; Notable for the following components:  Result Value   Lipase 53 (*)    All other components within normal limits  COMPREHENSIVE METABOLIC PANEL WITH GFR - Abnormal; Notable for the following components:   Chloride 97 (*)    Glucose, Bld 106 (*)    All other components within normal limits  CBC - Abnormal; Notable for the following components:   WBC 12.0 (*)    All other components within normal limits  URINALYSIS, ROUTINE W REFLEX MICROSCOPIC - Abnormal; Notable for the following components:   Leukocytes,Ua TRACE (*)    All other components within normal limits  CULTURE, BLOOD (ROUTINE X 2)  CULTURE, BLOOD (ROUTINE X 2)  LACTIC ACID, PLASMA  LACTIC ACID, PLASMA    EKG: None  Radiology: CT ABDOMEN PELVIS W CONTRAST Result Date: 08/01/2023 CLINICAL DATA:  Abdominal pain, acute, nonlocalized LLQ abdominal pain EXAM: CT ABDOMEN AND PELVIS WITH CONTRAST TECHNIQUE: Multidetector CT imaging of the  abdomen and pelvis was performed using the standard protocol following bolus administration of intravenous contrast. RADIATION DOSE REDUCTION: This exam was performed according to the departmental dose-optimization program which includes automated exposure control, adjustment of the mA and/or kV according to patient size and/or use of iterative reconstruction technique. CONTRAST:  80mL OMNIPAQUE IOHEXOL 300 MG/ML  SOLN COMPARISON:  None Available. FINDINGS: Lower chest: Tiny hiatal hernia.  No acute abnormality. Hepatobiliary: Subcentimeter pericentimeter hypodensities of the liver likely hepatic cysts. No gallstones, gallbladder wall thickening, or pericholecystic fluid. No biliary dilatation. Pancreas: No focal lesion. Normal pancreatic contour. No surrounding inflammatory changes. Enlarged proximal main pancreatic duct measuring up to 0.9 cm. Spleen: Normal in size without focal abnormality. Adrenals/Urinary Tract: No adrenal nodule bilaterally. Bilateral kidneys enhance symmetrically. No hydronephrosis. No hydroureter. The urinary bladder is unremarkable. Stomach/Bowel: Stomach is within normal limits. No evidence of small bowel wall thickening or dilatation. Colonic diverticulosis. Long segment mid sigmoid bowel wall thickening with pericolonic fat stranding and suggestion of a developing intramural 1.9 cm abscess formation. Appendix appears normal. Vascular/Lymphatic: No abdominal aorta or iliac aneurysm. Severe atherosclerotic plaque of the aorta and its branches. No abdominal, pelvic, or inguinal lymphadenopathy. Reproductive: Status post hysterectomy. No adnexal masses. Other: Query trace free gas along the superior aspect of the colon (4:52) suggestive of micro perforation. No organized fluid collection. Musculoskeletal: No abdominal wall hernia or abnormality. No suspicious lytic or blastic osseous lesions. No acute displaced fracture. Right pubic symphysis sclerosis. IMPRESSION: 1. Colonic diverticulosis  with long segment mid sigmoid colon acute diverticulitis. Suggestion of a developing intramural 1.9 cm abscess formation and micro perforation. Recommend colonoscopy status post treatment and status post complete resolution of inflammatory changes to exclude an underlying lesion. 2. Enlarged main pancreatic duct dilatation (0.9 cm). Query underlying IPMN versus obstructive mass proximally. When the patient is clinically stable and able to follow directions and hold their breath (preferably as an outpatient) further evaluation with dedicated abdominal MRI pancreatic protocol should be considered. 3. Tiny hiatal hernia. 4.  Aortic Atherosclerosis (ICD10-I70.0). Electronically Signed   By: Morgane  Naveau M.D.   On: 08/01/2023 17:13     Procedures   Medications Ordered in the ED  cefTRIAXone (ROCEPHIN) 2 g in sodium chloride  0.9 % 100 mL IVPB (2 g Intravenous New Bag/Given 08/01/23 1731)    And  metroNIDAZOLE (FLAGYL) IVPB 500 mg (500 mg Intravenous New Bag/Given 08/01/23 1822)  iohexol (OMNIPAQUE) 300 MG/ML solution 100 mL (80 mLs Intravenous Contrast Given 08/01/23 1627)  morphine (PF) 4 MG/ML injection 4 mg (4 mg Intravenous Given 08/01/23  1638)  ondansetron  (ZOFRAN ) injection 4 mg (4 mg Intravenous Given 08/01/23 1636)                                    Medical Decision Making Amount and/or Complexity of Data Reviewed Labs: ordered. Radiology: ordered.   This patient is a 73 y.o. female  who presents to the ED for concern of abdominal pain.   Differential diagnoses prior to evaluation: The emergent differential diagnosis includes, but is not limited to,  The causes of generalized abdominal pain include but are not limited to AAA, mesenteric ischemia, appendicitis, diverticulitis, DKA, gastritis, gastroenteritis, AMI, nephrolithiasis, pancreatitis, peritonitis, adrenal insufficiency,lead poisoning, iron toxicity, intestinal ischemia, constipation, UTI,SBO/LBO, splenic rupture, biliary disease,  IBD, IBS, PUD, or hepatitis. This is not an exhaustive differential.   Past Medical History / Co-morbidities / Social History:  hyperlipidemia, GERD  Physical Exam: Physical exam performed. The pertinent findings include: Some hypertension on arrival, blood pressure 160/79, vital signs otherwise stable.  On reassessment blood pressure 122/59, pulse is increased to 108, she remains afebrile, temp 98.1.  Focal tenderness in the left lower quadrant without rebound, rigidity, guarding, no overt peritonitis on exam.  Lab Tests/Imaging studies: I personally interpreted labs/imaging and the pertinent results include: CBC with mild leukocytosis, blood cells 12, CMP overall unremarkable other than mild hypochloremia, chloride 97.  UA with trace leukocytes, but no evidence of acute urinary tract infection, lipase mildly elevated 53.  I independently interpreted ct abd pelvis with contrast which shows:  1. Colonic diverticulosis with long segment mid sigmoid colon acute  diverticulitis. Suggestion of a developing intramural 1.9 cm abscess  formation and micro perforation. Recommend colonoscopy status post  treatment and status post complete resolution of inflammatory  changes to exclude an underlying lesion.  2. Enlarged main pancreatic duct dilatation (0.9 cm). Query  underlying IPMN versus obstructive mass proximally. When the patient  is clinically stable and able to follow directions and hold their  breath (preferably as an outpatient) further evaluation with  dedicated abdominal MRI pancreatic protocol should be considered.  3. Tiny hiatal hernia.  4.  Aortic Atherosclerosis (ICD10-I70.0).  I agree with the radiologist interpretation.   Medications: I ordered medication including morphine, Zofran , Rocephin, Flagyl for acute diverticulitis with microperforations and intramural abscess.  I have reviewed the patients home medicines and have made adjustments as needed.   Consults: I spoke with the  hospitalist, Dr. Lus Salter who agrees to admission for acute diverticulitis with microperforation and abscess, spoke with the surgeon Dr. Camilo Cella who agrees to consult, no plan for emergent surgery at this time based on her clinical condition.  Disposition: After consideration of the diagnostic results and the patients response to treatment, I feel that patient would benefit from admission as discussed above  Final diagnoses:  Diverticulitis    ED Discharge Orders     None          Stefan Edge 08/01/23 Denna Fish, MD 08/01/23 2329

## 2023-08-01 NOTE — ED Notes (Signed)
 Judy Donovan with cl called for transport

## 2023-08-02 DIAGNOSIS — K5720 Diverticulitis of large intestine with perforation and abscess without bleeding: Secondary | ICD-10-CM | POA: Diagnosis not present

## 2023-08-02 LAB — COMPREHENSIVE METABOLIC PANEL WITH GFR
ALT: 12 U/L (ref 0–44)
AST: 17 U/L (ref 15–41)
Albumin: 2.5 g/dL — ABNORMAL LOW (ref 3.5–5.0)
Alkaline Phosphatase: 59 U/L (ref 38–126)
Anion gap: 9 (ref 5–15)
BUN: 8 mg/dL (ref 8–23)
CO2: 24 mmol/L (ref 22–32)
Calcium: 8.4 mg/dL — ABNORMAL LOW (ref 8.9–10.3)
Chloride: 103 mmol/L (ref 98–111)
Creatinine, Ser: 0.68 mg/dL (ref 0.44–1.00)
GFR, Estimated: >60 mL/min (ref >60)
Glucose, Bld: 151 mg/dL — ABNORMAL HIGH (ref 70–99)
Potassium: 3.8 mmol/L (ref 3.5–5.1)
Sodium: 136 mmol/L (ref 135–145)
Total Bilirubin: 0.3 mg/dL (ref 0.0–1.2)
Total Protein: 6.4 g/dL — ABNORMAL LOW (ref 6.5–8.1)

## 2023-08-02 LAB — CBC
HCT: 35 % — ABNORMAL LOW (ref 36.0–46.0)
Hemoglobin: 11.6 g/dL — ABNORMAL LOW (ref 12.0–15.0)
MCH: 28.9 pg (ref 26.0–34.0)
MCHC: 33.1 g/dL (ref 30.0–36.0)
MCV: 87.1 fL (ref 80.0–100.0)
Platelets: 219 10*3/uL (ref 150–400)
RBC: 4.02 MIL/uL (ref 3.87–5.11)
RDW: 11.6 % (ref 11.5–15.5)
WBC: 14.2 10*3/uL — ABNORMAL HIGH (ref 4.0–10.5)
nRBC: 0 % (ref 0.0–0.2)

## 2023-08-02 MED ORDER — ACETAMINOPHEN 325 MG PO TABS
650.0000 mg | ORAL_TABLET | Freq: Four times a day (QID) | ORAL | Status: DC | PRN
  Administered 2023-08-02: 650 mg via ORAL
  Filled 2023-08-02: qty 2

## 2023-08-02 MED ORDER — IPRATROPIUM-ALBUTEROL 0.5-2.5 (3) MG/3ML IN SOLN: 3.0000 mL | RESPIRATORY_TRACT | Status: DC | PRN

## 2023-08-02 MED ORDER — SODIUM CHLORIDE 0.9 % IV SOLN
2.0000 g | Freq: Three times a day (TID) | INTRAVENOUS | Status: DC
  Administered 2023-08-02 – 2023-08-04 (×6): 2 g via INTRAVENOUS
  Filled 2023-08-02 (×6): qty 12.5

## 2023-08-02 MED ORDER — SENNOSIDES-DOCUSATE SODIUM 8.6-50 MG PO TABS: 1.0000 | ORAL_TABLET | Freq: Every evening | ORAL | Status: DC | PRN

## 2023-08-02 MED ORDER — GUAIFENESIN 100 MG/5ML PO LIQD: 5.0000 mL | ORAL | Status: DC | PRN

## 2023-08-02 MED ORDER — DEXTROSE IN LACTATED RINGERS 5 % IV SOLN: INTRAVENOUS | Status: AC

## 2023-08-02 MED ORDER — TRAZODONE HCL 50 MG PO TABS
50.0000 mg | ORAL_TABLET | Freq: Every evening | ORAL | Status: DC | PRN
  Filled 2023-08-02: qty 1

## 2023-08-02 MED ORDER — METOPROLOL TARTRATE 5 MG/5ML IV SOLN: 5.0000 mg | INTRAVENOUS | Status: DC | PRN

## 2023-08-02 MED ORDER — HYDRALAZINE HCL 20 MG/ML IJ SOLN: 10.0000 mg | INTRAMUSCULAR | Status: DC | PRN

## 2023-08-02 MED ORDER — METRONIDAZOLE 500 MG/100ML IV SOLN
500.0000 mg | Freq: Two times a day (BID) | INTRAVENOUS | Status: DC
  Administered 2023-08-02 – 2023-08-04 (×4): 500 mg via INTRAVENOUS
  Filled 2023-08-02 (×4): qty 100

## 2023-08-02 NOTE — Progress Notes (Addendum)
 Pt discontented with scheduled IV protonix received at 2100 - reports ineffectiveness. On call [facility] contacted to request further guidance.   Pt expresses discontent with left arm AC PIV. She states that they aren't supposed to put it there. IV team order in at pt's request. PIV redressed with new dressing.   Pt also expresses chagrin at needing to wear telemetry box. States that they put it on her because her heart rate was elevated when she arrived, but now that she has been steadily in NSR, she requests to remove the telemetry. Explained that there is an order to keep telemetry on. On call [facility] contacted.

## 2023-08-02 NOTE — Progress Notes (Signed)
 IR eval request received for sigmoid colon fluid collection. Case reviewed with Dr. Nereida Banning. Collection is small (1.9cm) and appears to be on opposite side of sigmoid colon as diverticulitis on 6/15 CT.  Afebrile, VSS. Recommend continued abx therapy and repeat CT as indicated.  Communicated this with IP MD.  Please don't hesitate to consult IR again if significant change in appearance on imaging/change in patient condition.

## 2023-08-02 NOTE — Hospital Course (Addendum)
 Brief Narrative:  73 y.o. female with medical history significant of GERD, history of internal/external hemorrhoids, osteoporosis, cataracts, hyperlipidemia, history of breast cancer who was seen at the [facility] after presenting with abdominal pain.  Was diagnosed with diverticulitis on May 26 started on Cipro Flagyl later changed to Augmentin  but due to worsening pain comes back to the hospital.  CT abdomen pelvis shows acute diverticulitis with intramural 1.9 cm perforation/abscess. Patient did significantly well with 48 hours of IV antibiotics and white count resolved.  She was tolerating p.o. without any issues.  Will transition to p.o. antibiotics for 7 more days to complete total 10-day course. Medically stable for discharge  Assessment & Plan:  Principal Problem:   Diverticulitis of intestine with perforation and abscess Active Problems:   Hyperlipidemia   Malignant neoplasm of upper-outer quadrant of right breast in female, estrogen receptor positive (HCC)   Acute sigmoid diverticulitis with perforation, 1.9 cm abscess Currently on IV Rocephin and Flagyl.  Discussed with IR.  Significantly improved with conservative management.  Will transition to p.o. Cipro and Flagyl for 7 more days.  Follow-up outpatient PCP.  She will need a colonoscopy in about 6 weeks  Dilated pancreatic duct - Recommend outpatient MRCP and GI follow-up   Hyperlipidemia  Will resume home regimen when stable   Right-sided breast abscess  In remission.  Continue per her oncologist.   Elevated blood pressure  no history of hypertension.  Continue to monitor. IV as needed   DVT prophylaxis: SCDs Start: 08/01/23 1956    Code Status: Full Code Family Communication: None Status is: Inpatient Continue hospital stay for IV antibiotics.  Hopefully discharge in 24-48 hours    Subjective: Sitting up in the chair, feeling better today.  Examination:  General exam: Appears calm and comfortable   Respiratory system: Clear to auscultation. Respiratory effort normal. Cardiovascular system: S1 & S2 heard, RRR. No JVD, murmurs, rubs, gallops or clicks. No pedal edema. Gastrointestinal system: Abdomen is nondistended, soft and nontender. No organomegaly or masses felt. Normal bowel sounds heard. Central nervous system: Alert and oriented. No focal neurological deficits. Extremities: Symmetric 5 x 5 power. Skin: No rashes, lesions or ulcers Psychiatry: Judgement and insight appear normal. Mood & affect appropriate.

## 2023-08-02 NOTE — Consult Note (Signed)
 Judy Donovan 01-May-1950  [id number].    Requesting MD: Muriel Arm Chief Complaint/Reason for Consult: Abdominal pain   HPI: Judy Donovan is a 73 y.o. female with past medical history of GERD, uterine fibroid, osteoporosis presents to [facility] ED with severe abdominal pain.  Past Medical History: As below Prior Abdominal Surgeries: Tubal ligation and hysterectomy Blood Thinners: No Allergies: As below Tobacco Use: Quit 44 years ago Alcohol Use: Yes, occasionally Substance use: No   ROS: Review of Systems  Constitutional:  Negative for chills and fever.  Gastrointestinal:  Positive for abdominal pain and constipation. Negative for blood in stool, nausea and vomiting.  Genitourinary:  Negative for dysuria, frequency and urgency.  Skin:  Negative for rash.    Family History  Problem Relation Age of Onset   Osteoporosis Mother    Hypertension Mother    Cancer Father        Lung cancer   Lung cancer Father 74    Past Medical History:  Diagnosis Date   Cataracts, bilateral    GERD (gastroesophageal reflux disease)    History of uterine fibroid    Internal and external hemorrhoids without complication    Osteoporosis    PONV (postoperative nausea and vomiting)     Past Surgical History:  Procedure Laterality Date   BREAST BIOPSY Right 08/21/2021   BREAST LUMPECTOMY Right 09/26/2021   BREAST LUMPECTOMY WITH RADIOACTIVE SEED AND SENTINEL LYMPH NODE BIOPSY Right 09/26/2021   Procedure: RIGHT BREAST RADIOACTIVE SEED LOCALIZED LUMPECTOMY AND SENTINEL NODE BIOPSY;  Surgeon: Caralyn Chandler, MD;  Location: [facility];  Service: General;  Laterality: Right;   CATARACT EXTRACTION W/ INTRAOCULAR LENS  IMPLANT, BILATERAL  2015   HEMORRHOID SURGERY N/A 11/19/2015   Procedure: HEMORRHOIDECTOMY;  Surgeon: Joyce Nixon, MD;  Location: [facility];  Service: General;  Laterality: N/A;   TUBAL LIGATION Bilateral 1980's   VAGINAL  HYSTERECTOMY  05/12/2004   w/  Bilateral Salpingoophorectomy    Social History:  reports that she quit smoking about 44 years ago. Her smoking use included cigarettes. She started smoking about 49 years ago. She has never used smokeless tobacco. She reports current alcohol use of about 1.0 standard drink of alcohol per week. She reports that she does not use drugs.  Allergies:  Allergies  Allergen Reactions   Prolia  [Denosumab ] Other (See Comments)    Makes me feel funny, dizziness   Amoxicillin  Rash    Makes me feel funny    Medications Prior to Admission  Medication Sig Dispense Refill   aspirin 81 MG tablet Take 81 mg by mouth daily.     cetirizine (ZYRTEC) 5 MG tablet Take 5 mg by mouth as needed.      escitalopram (LEXAPRO) 10 MG tablet Take 10 mg by mouth daily.     Lansoprazole (PREVACID PO) Take by mouth as needed.     MAGNESIUM PO Take 350 mg by mouth at bedtime.     Omega-3 Fatty Acids (FISH OIL PO) Take by mouth daily. 1 teaspoon in pm (Patient not taking: Reported on 03/06/2022)     Probiotic Product (ALIGN PO) Take 1 capsule by mouth daily.       Physical Exam: Blood pressure 115/70, pulse 94, temperature 98.2 F (36.8 C), temperature source Oral, resp. rate (!) 25, height 5' 5 (1.651 m), weight 71.2 kg, last menstrual period 04/16/2004, SpO2 98%. Physical Exam Constitutional:      General: She is not in acute  distress.    Appearance: She is not ill-appearing.   Cardiovascular:     Rate and Rhythm: Normal rate and regular rhythm.  Pulmonary:     Effort: Pulmonary effort is normal. Tachypnea present. No respiratory distress.  Abdominal:     General: There is no distension.     Tenderness: There is abdominal tenderness in the suprapubic area and left lower quadrant.   Skin:    General: Skin is warm and dry.   Neurological:     General: No focal deficit present.     Mental Status: She is alert and oriented to person, place, and time.      Results for  orders placed or performed during the hospital encounter of 08/01/23 (from the past 48 hours)  Urinalysis, Routine w reflex microscopic -Urine, Clean Catch     Status: Abnormal   Collection Time: 08/01/23  3:38 PM  Result Value Ref Range   Color, Urine YELLOW YELLOW   APPearance CLEAR CLEAR   Specific Gravity, Urine 1.024 1.005 - 1.030   pH 5.5 5.0 - 8.0   Glucose, UA NEGATIVE NEGATIVE mg/dL   Hgb urine dipstick NEGATIVE NEGATIVE   Bilirubin Urine NEGATIVE NEGATIVE   Ketones, ur NEGATIVE NEGATIVE mg/dL   Protein, ur NEGATIVE NEGATIVE mg/dL   Nitrite NEGATIVE NEGATIVE   Leukocytes,Ua TRACE (A) NEGATIVE   RBC / HPF 0-5 0 - 5 RBC/hpf   WBC, UA 0-5 0 - 5 WBC/hpf   Bacteria, UA NONE SEEN NONE SEEN   Squamous Epithelial / HPF 0-5 0 - 5 /HPF   Mucus PRESENT    Hyaline Casts, UA PRESENT     Comment: Performed at [facility], [address] [place], [place] [address]  Lipase, blood     Status: Abnormal   Collection Time: 08/01/23  3:44 PM  Result Value Ref Range   Lipase 53 (H) 11 - 51 U/L    Comment: Performed at [facility], [address] [place], [place] [address]  Comprehensive metabolic panel     Status: Abnormal   Collection Time: 08/01/23  3:44 PM  Result Value Ref Range   Sodium 135 135 - 145 mmol/L   Potassium 3.9 3.5 - 5.1 mmol/L   Chloride 97 (L) 98 - 111 mmol/L   CO2 24 22 - 32 mmol/L   Glucose, Bld 106 (H) 70 - 99 mg/dL    Comment: Glucose reference range applies only to samples taken after fasting for at least 8 hours.   BUN 9 8 - 23 mg/dL   Creatinine, Ser 0.98 0.44 - 1.00 mg/dL   Calcium 9.5 8.9 - 11.9 mg/dL   Total Protein 8.0 6.5 - 8.1 g/dL   Albumin 3.9 3.5 - 5.0 g/dL   AST 24 15 - 41 U/L   ALT 15 0 - 44 U/L   Alkaline Phosphatase 105 38 - 126 U/L   Total Bilirubin 0.3 0.0 - 1.2 mg/dL   GFR, Estimated >14 >78 mL/min    Comment: (NOTE) Calculated using the CKD-EPI Creatinine Equation (2021)    Anion gap 14 5 - 15     Comment: Performed at [facility], [address] [place], [place] [address]  CBC     Status: Abnormal   Collection Time: 08/01/23  3:44 PM  Result Value Ref Range   WBC 12.0 (H) 4.0 - 10.5 K/uL   RBC 4.62 3.87 - 5.11 MIL/uL   Hemoglobin 13.2 12.0 - 15.0 g/dL   HCT 39.1  36.0 - 46.0 %   MCV 84.6 80.0 - 100.0 fL   MCH 28.6 26.0 - 34.0 pg   MCHC 33.8 30.0 - 36.0 g/dL   RDW 16.1 09.6 - 04.5 %   Platelets 266 150 - 400 K/uL   nRBC 0.0 0.0 - 0.2 %    Comment: Performed at [facility], [address] [place], [place] [address]  Lactic acid, plasma     Status: None   Collection Time: 08/01/23  6:50 PM  Result Value Ref Range   Lactic Acid, Venous 1.2 0.5 - 1.9 mmol/L    Comment: Performed at [facility], [address], [place], [place] [address]  Culture, blood (Routine X 2) w Reflex to ID Panel     Status: None (Preliminary result)   Collection Time: 08/01/23  6:50 PM   Specimen: BLOOD  Result Value Ref Range   Specimen Description      BLOOD BLOOD LEFT WRIST Performed at [facility], [address] [place], [place] [address]    Special Requests      Blood Culture results may not be optimal due to an inadequate volume of blood received in culture bottles Performed at [facility], [address] [place], [place] [address]    Culture      NO GROWTH < 12 HOURS Performed at [facility], [address], [place], [place] [address]    Report Status PENDING   Lactic acid, plasma     Status: None   Collection Time: 08/01/23  8:30 PM  Result Value Ref Range   Lactic Acid, Venous 1.4 0.5 - 1.9 mmol/L    Comment: Performed at [facility], [address], [place], [place] [address]  Culture, blood (Routine X 2) w Reflex to ID Panel     Status: None (Preliminary result)   Collection Time: 08/01/23  8:30 PM   Specimen: BLOOD  Result Value Ref Range   Specimen  Description BLOOD SITE NOT SPECIFIED    Special Requests      BOTTLES DRAWN AEROBIC AND ANAEROBIC Blood Culture adequate volume   Culture      NO GROWTH < 12 HOURS Performed at [facility], [address] [place], [place] [address]    Report Status PENDING   Comprehensive metabolic panel     Status: Abnormal   Collection Time: 08/02/23  3:49 AM  Result Value Ref Range   Sodium 136 135 - 145 mmol/L   Potassium 3.8 3.5 - 5.1 mmol/L   Chloride 103 98 - 111 mmol/L   CO2 24 22 - 32 mmol/L   Glucose, Bld 151 (H) 70 - 99 mg/dL    Comment: Glucose reference range applies only to samples taken after fasting for at least 8 hours.   BUN 8 8 - 23 mg/dL   Creatinine, Ser 3.24 0.44 - 1.00 mg/dL   Calcium 8.4 (L) 8.9 - 10.3 mg/dL   Total Protein 6.4 (L) 6.5 - 8.1 g/dL   Albumin 2.5 (L) 3.5 - 5.0 g/dL   AST 17 15 - 41 U/L   ALT 12 0 - 44 U/L   Alkaline Phosphatase 59 38 - 126 U/L   Total Bilirubin 0.3 0.0 - 1.2 mg/dL   GFR, Estimated >40 >10 mL/min    Comment: (NOTE) Calculated using the CKD-EPI Creatinine Equation (2021)    Anion gap 9 5 - 15    Comment: Performed at [facility],  [address], [place], [place] [address]  CBC     Status: Abnormal   Collection Time: 08/02/23  3:49 AM  Result Value Ref Range   WBC 14.2 (H) 4.0 - 10.5 K/uL   RBC 4.02 3.87 - 5.11 MIL/uL   Hemoglobin 11.6 (L) 12.0 - 15.0 g/dL   HCT 60.4 (L) 54.0 - 98.1 %   MCV 87.1 80.0 - 100.0 fL   MCH 28.9 26.0 - 34.0 pg   MCHC 33.1 30.0 - 36.0 g/dL   RDW 19.1 47.8 - 29.5 %   Platelets 219 150 - 400 K/uL   nRBC 0.0 0.0 - 0.2 %    Comment: Performed at [facility], [address], [place], [place] [address]   CT ABDOMEN PELVIS W CONTRAST Result Date: 08/01/2023 CLINICAL DATA:  Abdominal pain, acute, nonlocalized LLQ abdominal pain EXAM: CT ABDOMEN AND PELVIS WITH CONTRAST TECHNIQUE: Multidetector CT imaging of the abdomen and pelvis was performed using the standard protocol following bolus  administration of intravenous contrast. RADIATION DOSE REDUCTION: This exam was performed according to the departmental dose-optimization program which includes automated exposure control, adjustment of the mA and/or kV according to patient size and/or use of iterative reconstruction technique. CONTRAST:  80mL OMNIPAQUE IOHEXOL 300 MG/ML  SOLN COMPARISON:  None Available. FINDINGS: Lower chest: Tiny hiatal hernia.  No acute abnormality. Hepatobiliary: Subcentimeter pericentimeter hypodensities of the liver likely hepatic cysts. No gallstones, gallbladder wall thickening, or pericholecystic fluid. No biliary dilatation. Pancreas: No focal lesion. Normal pancreatic contour. No surrounding inflammatory changes. Enlarged proximal main pancreatic duct measuring up to 0.9 cm. Spleen: Normal in size without focal abnormality. Adrenals/Urinary Tract: No adrenal nodule bilaterally. Bilateral kidneys enhance symmetrically. No hydronephrosis. No hydroureter. The urinary bladder is unremarkable. Stomach/Bowel: Stomach is within normal limits. No evidence of small bowel wall thickening or dilatation. Colonic diverticulosis. Long segment mid sigmoid bowel wall thickening with pericolonic fat stranding and suggestion of a developing intramural 1.9 cm abscess formation. Appendix appears normal. Vascular/Lymphatic: No abdominal aorta or iliac aneurysm. Severe atherosclerotic plaque of the aorta and its branches. No abdominal, pelvic, or inguinal lymphadenopathy. Reproductive: Status post hysterectomy. No adnexal masses. Other: Query trace free gas along the superior aspect of the colon (4:52) suggestive of micro perforation. No organized fluid collection. Musculoskeletal: No abdominal wall hernia or abnormality. No suspicious lytic or blastic osseous lesions. No acute displaced fracture. Right pubic symphysis sclerosis. IMPRESSION: 1. Colonic diverticulosis with long segment mid sigmoid colon acute diverticulitis. Suggestion of a  developing intramural 1.9 cm abscess formation and micro perforation. Recommend colonoscopy status post treatment and status post complete resolution of inflammatory changes to exclude an underlying lesion. 2. Enlarged main pancreatic duct dilatation (0.9 cm). Query underlying IPMN versus obstructive mass proximally. When the patient is clinically stable and able to follow directions and hold their breath (preferably as an outpatient) further evaluation with dedicated abdominal MRI pancreatic protocol should be considered. 3. Tiny hiatal hernia. 4.  Aortic Atherosclerosis (ICD10-I70.0). Electronically Signed   By: Morgane  Naveau M.D.   On: 08/01/2023 17:13    Anti-infectives (From admission, onward)    Start     Dose/Rate Route Frequency Ordered Stop   08/02/23 1700  cefTRIAXone (ROCEPHIN) 2 g in sodium chloride  0.9 % 100 mL IVPB        2 g 200 mL/hr over 30 Minutes Intravenous Every 24 hours 08/01/23 2026     08/02/23 0630  metroNIDAZOLE (FLAGYL) IVPB 500 mg  500 mg 100 mL/hr over 60 Minutes Intravenous Every 12 hours 08/01/23 2125     08/01/23 2045  cefTRIAXone (ROCEPHIN) 1 g in sodium chloride  0.9 % 100 mL IVPB  Status:  Discontinued        1 g 200 mL/hr over 30 Minutes Intravenous Every 24 hours 08/01/23 1956 08/01/23 2026   08/01/23 2045  metroNIDAZOLE (FLAGYL) IVPB 500 mg  Status:  Discontinued        500 mg 100 mL/hr over 60 Minutes Intravenous Every 12 hours 08/01/23 1956 08/01/23 2125   08/01/23 1730  cefTRIAXone (ROCEPHIN) 2 g in sodium chloride  0.9 % 100 mL IVPB       Placed in And Linked Group   2 g 200 mL/hr over 30 Minutes Intravenous  Once 08/01/23 1717 08/01/23 1801   08/01/23 1730  metroNIDAZOLE (FLAGYL) IVPB 500 mg       Placed in And Linked Group   500 mg 100 mL/hr over 60 Minutes Intravenous  Once 08/01/23 1717 08/01/23 1922       Assessment/Plan Sigmoid colon diverticulitis     FEN -n.p.o. to promote bowel rest. VTE -SCDs ID -Rocephin and  Flagyl Foley -none Plan -continue IV antibiotics and continue monitoring white blood cell count.  Intramural abscess present in colon which makes it challenging for IR drainage.  Hopefully with resolution with IV antibiotics in order to avoid colostomy/colectomy.  White count is 14.2.  Repeat CT scan to monitor progress of antibiotic course.  IR was consulted and recommends continuation of IV antibiotics at this time.   I reviewed nursing notes, ED provider notes, last 24 h vitals and pain scores, last 24 h labs and trends, and last 24 h imaging results.   This care required moderate level of medical decision making.     Mirta Ammon, [facility] 08/02/2023, 9:38 AM Please see Amion for pager number during day hours 7:00am-4:30pm

## 2023-08-02 NOTE — Progress Notes (Addendum)
 Pt verbally confrontational to front desk staff and this RN. Pt has concerns about staff attentiveness. Pt is A&Ox4, independent, but requests moderate assistance with ADL's.   At shift change, pt disconnected herself from the IVF. Pt informed that order will expire soon, pt perturbed that IVF order was expiring.   Pt unsettled that this RN was wearing a mask. This RN spoke loudly to ensure that pt could recognize speech and education.   Pt called her daughter to instruct her to call this RN. This RN spoke with daughter; daughter had concerns of inattention. Updated daughter that I had just left pt's room, and was retrieving pt's medications from the medication room and would be back in shortly.   Pt maintains that she pulled out her PIV. Showed pt her IV that was currently in her left AC. Pt maintains that she pulled out her PIV. Flushed PIV, no resistance, administered IV Protonix.   2116: Contacted overnight TRH re: pt requests IVF.

## 2023-08-02 NOTE — Progress Notes (Deleted)
 Subjective: CC: Patient seen laying comfortably in bed with complaints of moderate pain in the left lower quadrant and suprapubic area.  Patient stated that she had not been eating for the past couple of days but denies nausea/vomiting.  Endorses some reduced bowel movement.  Patient is hemodynamically stable with some tachypnea but afebrile at this time.  Objective: Vital signs in last 24 hours: Temp:  [98.1 F (36.7 C)-99.5 F (37.5 C)] 98.2 F (36.8 C) (06/16 0756) Pulse Rate:  [58-108] 94 (06/16 0756) Resp:  [13-33] 25 (06/16 0756) BP: (102-162)/(48-79) 115/70 (06/16 0756) SpO2:  [74 %-100 %] 98 % (06/16 0756) Weight:  [71.2 kg-71.7 kg] 71.2 kg (06/15 1945) Last BM Date : 08/01/23  Intake/Output from previous day: No intake/output data recorded. Intake/Output this shift: No intake/output data recorded.  PE: Physical Exam Constitutional:      General: She is not in acute distress.    Appearance: She is not ill-appearing.  Pulmonary:     Effort: Tachypnea present. No respiratory distress.  Abdominal:     General: There is no distension.     Palpations: Abdomen is soft.     Tenderness: There is abdominal tenderness. Negative signs include Murphy's sign, Rovsing's sign and McBurney's sign.     Comments: Moderate tenderness in the left lower quadrant and suprapubic region.   Skin:    General: Skin is warm and dry.   Neurological:     General: No focal deficit present.     Mental Status: She is alert.     Lab Results:  Recent Labs    08/01/23 1544 08/02/23 0349  WBC 12.0* 14.2*  HGB 13.2 11.6*  HCT 39.1 35.0*  PLT 266 219   BMET Recent Labs    08/01/23 1544 08/02/23 0349  NA 135 136  K 3.9 3.8  CL 97* 103  CO2 24 24  GLUCOSE 106* 151*  BUN 9 8  CREATININE 0.67 0.68  CALCIUM 9.5 8.4*   PT/INR No results for input(s): LABPROT, INR in the last 72 hours. CMP     Component Value Date/Time   NA 136 08/02/2023 0349   NA 141 07/28/2021  0840   K 3.8 08/02/2023 0349   CL 103 08/02/2023 0349   CO2 24 08/02/2023 0349   GLUCOSE 151 (H) 08/02/2023 0349   BUN 8 08/02/2023 0349   BUN 12 07/28/2021 0840   CREATININE 0.68 08/02/2023 0349   CREATININE 0.92 08/27/2021 1233   CREATININE 0.72 02/12/2014 1637   CALCIUM 8.4 (L) 08/02/2023 0349   PROT 6.4 (L) 08/02/2023 0349   PROT 7.0 07/28/2021 0840   ALBUMIN 2.5 (L) 08/02/2023 0349   ALBUMIN 3.9 07/28/2021 0840   AST 17 08/02/2023 0349   AST 25 08/27/2021 1233   ALT 12 08/02/2023 0349   ALT 26 08/27/2021 1233   ALKPHOS 59 08/02/2023 0349   BILITOT 0.3 08/02/2023 0349   BILITOT 0.4 08/27/2021 1233   GFRNONAA >60 08/02/2023 0349   GFRNONAA >60 08/27/2021 1233   Lipase     Component Value Date/Time   LIPASE 53 (H) 08/01/2023 1544    Studies/Results: CT ABDOMEN PELVIS W CONTRAST Result Date: 08/01/2023 CLINICAL DATA:  Abdominal pain, acute, nonlocalized LLQ abdominal pain EXAM: CT ABDOMEN AND PELVIS WITH CONTRAST TECHNIQUE: Multidetector CT imaging of the abdomen and pelvis was performed using the standard protocol following bolus administration of intravenous contrast. RADIATION DOSE REDUCTION: This exam was performed according to the departmental dose-optimization program which  includes automated exposure control, adjustment of the mA and/or kV according to patient size and/or use of iterative reconstruction technique. CONTRAST:  80mL OMNIPAQUE IOHEXOL 300 MG/ML  SOLN COMPARISON:  None Available. FINDINGS: Lower chest: Tiny hiatal hernia.  No acute abnormality. Hepatobiliary: Subcentimeter pericentimeter hypodensities of the liver likely hepatic cysts. No gallstones, gallbladder wall thickening, or pericholecystic fluid. No biliary dilatation. Pancreas: No focal lesion. Normal pancreatic contour. No surrounding inflammatory changes. Enlarged proximal main pancreatic duct measuring up to 0.9 cm. Spleen: Normal in size without focal abnormality. Adrenals/Urinary Tract: No adrenal  nodule bilaterally. Bilateral kidneys enhance symmetrically. No hydronephrosis. No hydroureter. The urinary bladder is unremarkable. Stomach/Bowel: Stomach is within normal limits. No evidence of small bowel wall thickening or dilatation. Colonic diverticulosis. Long segment mid sigmoid bowel wall thickening with pericolonic fat stranding and suggestion of a developing intramural 1.9 cm abscess formation. Appendix appears normal. Vascular/Lymphatic: No abdominal aorta or iliac aneurysm. Severe atherosclerotic plaque of the aorta and its branches. No abdominal, pelvic, or inguinal lymphadenopathy. Reproductive: Status post hysterectomy. No adnexal masses. Other: Query trace free gas along the superior aspect of the colon (4:52) suggestive of micro perforation. No organized fluid collection. Musculoskeletal: No abdominal wall hernia or abnormality. No suspicious lytic or blastic osseous lesions. No acute displaced fracture. Right pubic symphysis sclerosis. IMPRESSION: 1. Colonic diverticulosis with long segment mid sigmoid colon acute diverticulitis. Suggestion of a developing intramural 1.9 cm abscess formation and micro perforation. Recommend colonoscopy status post treatment and status post complete resolution of inflammatory changes to exclude an underlying lesion. 2. Enlarged main pancreatic duct dilatation (0.9 cm). Query underlying IPMN versus obstructive mass proximally. When the patient is clinically stable and able to follow directions and hold their breath (preferably as an outpatient) further evaluation with dedicated abdominal MRI pancreatic protocol should be considered. 3. Tiny hiatal hernia. 4.  Aortic Atherosclerosis (ICD10-I70.0). Electronically Signed   By: Morgane  Naveau M.D.   On: 08/01/2023 17:13    Anti-infectives: Anti-infectives (From admission, onward)    Start     Dose/Rate Route Frequency Ordered Stop   08/02/23 1700  cefTRIAXone (ROCEPHIN) 2 g in sodium chloride  0.9 % 100 mL IVPB         2 g 200 mL/hr over 30 Minutes Intravenous Every 24 hours 08/01/23 2026     08/02/23 0630  metroNIDAZOLE (FLAGYL) IVPB 500 mg        500 mg 100 mL/hr over 60 Minutes Intravenous Every 12 hours 08/01/23 2125     08/01/23 2045  cefTRIAXone (ROCEPHIN) 1 g in sodium chloride  0.9 % 100 mL IVPB  Status:  Discontinued        1 g 200 mL/hr over 30 Minutes Intravenous Every 24 hours 08/01/23 1956 08/01/23 2026   08/01/23 2045  metroNIDAZOLE (FLAGYL) IVPB 500 mg  Status:  Discontinued        500 mg 100 mL/hr over 60 Minutes Intravenous Every 12 hours 08/01/23 1956 08/01/23 2125   08/01/23 1730  cefTRIAXone (ROCEPHIN) 2 g in sodium chloride  0.9 % 100 mL IVPB       Placed in And Linked Group   2 g 200 mL/hr over 30 Minutes Intravenous  Once 08/01/23 1717 08/01/23 1801   08/01/23 1730  metroNIDAZOLE (FLAGYL) IVPB 500 mg       Placed in And Linked Group   500 mg 100 mL/hr over 60 Minutes Intravenous  Once 08/01/23 1717 08/01/23 1922        Assessment/Plan Sigmoid colon diverticulitis  FEN -n.p.o. to promote bowel rest. VTE -SCDs ID -Rocephin and Flagyl Foley -none Plan -continue IV antibiotics and continue monitoring white blood cell count.  Intramural abscess present in colon which makes it challenging for IR drainage.  Hopefully with resolution with IV antibiotics in order to avoid colostomy/colectomy.  White count is 14.2.  Repeat CT scan to monitor progress of antibiotic course.  IR was consulted and recommends continuation of IV antibiotics at this time.  I reviewed nursing notes, ED provider notes, last 24 h vitals and pain scores, last 24 h labs and trends, and last 24 h imaging results.  This care required moderate level of medical decision making.    LOS: 1 day    Mirta Ammon, [facility] 08/02/2023, 8:45 AM Please see Amion for pager number during day hours 7:00am-4:30pm

## 2023-08-02 NOTE — Progress Notes (Signed)
 PROGRESS NOTE    Judy Donovan  GEX:[id number] DOB: 1950/10/05 DOA: 08/01/2023 PCP: Ronna Coho, MD    Brief Narrative:  73 y.o. female with medical history significant of GERD, history of internal/external hemorrhoids, osteoporosis, cataracts, hyperlipidemia, history of breast cancer who was seen at the [facility] after presenting with abdominal pain.  Was diagnosed with diverticulitis on May 26 started on Cipro Flagyl later changed to Augmentin  but due to worsening pain comes back to the hospital.  CT abdomen pelvis shows acute diverticulitis with intramural 1.9 cm perforation/abscess.   Assessment & Plan:  Principal Problem:   Diverticulitis of intestine with perforation and abscess Active Problems:   Hyperlipidemia   Malignant neoplasm of upper-outer quadrant of right breast in female, estrogen receptor positive (HCC)   Acute sigmoid diverticulitis with perforation, 1.9 cm abscess Currently on IV Rocephin and Flagyl.  Discussed with IR.  Will proceed with conservative management at this time.  Anticipate total 14-21-day antibiotic course and repeat scan at that time.  Sooner if necessary.  Trend WBC.  Dilated pancreatic duct - Recommend outpatient MRCP and GI follow-up   Hyperlipidemia  Will resume home regimen when stable   Right-sided breast abscess  In remission.  Continue per her oncologist.   Elevated blood pressure  no history of hypertension.  Continue to monitor. IV as needed   DVT prophylaxis: SCDs Start: 08/01/23 1956    Code Status: Full Code Family Communication: Husband and daughter at bedside Status is: Inpatient Continue hospital stay for IV antibiotics    Subjective: Tells me her abdominal pain is much better this morning Last colonoscopy was in 2006 and she has had Cologuard since then but no colonoscopy   Examination:  General exam: Appears calm and comfortable  Respiratory system: Clear to auscultation. Respiratory effort  normal. Cardiovascular system: S1 & S2 heard, RRR. No JVD, murmurs, rubs, gallops or clicks. No pedal edema. Gastrointestinal system: Abdomen is nondistended, soft and nontender. No organomegaly or masses felt. Normal bowel sounds heard. Central nervous system: Alert and oriented. No focal neurological deficits. Extremities: Symmetric 5 x 5 power. Skin: No rashes, lesions or ulcers Psychiatry: Judgement and insight appear normal. Mood & affect appropriate.                Diet Orders (From admission, onward)     Start     Ordered   08/02/23 1236  Diet NPO time specified Except for: Sips with Meds, Ice Chips  Diet effective now       Question Answer Comment  Except for Sips with Meds   Except for Ice Chips      08/02/23 1237            Objective: Vitals:   08/02/23 0630 08/02/23 0631 08/02/23 0756 08/02/23 1220  BP:  (!) 111/59 115/70 (!) 110/57  Pulse:  86 94 81  Resp: 18 (!) 24 (!) 25 15  Temp:  98.6 F (37 C) 98.2 F (36.8 C) 98 F (36.7 C)  TempSrc:  Oral Oral Oral  SpO2:  96% 98%   Weight:      Height:        Intake/Output Summary (Last 24 hours) at 08/02/2023 1346 Last data filed at 08/02/2023 0800 Gross per 24 hour  Intake 0 ml  Output --  Net 0 ml   Filed Weights   08/01/23 1530 08/01/23 1945  Weight: 71.7 kg 71.2 kg    Scheduled Meds:  pantoprazole (PROTONIX) IV  40 mg Intravenous Q24H  Continuous Infusions:  ceFEPime (MAXIPIME) IV     dextrose  5% lactated ringers  100 mL/hr at 08/01/23 2208   metronidazole      Nutritional status     Body mass index is 26.11 kg/m.  Data Reviewed:   CBC: Recent Labs  Lab 08/01/23 1544 08/02/23 0349  WBC 12.0* 14.2*  HGB 13.2 11.6*  HCT 39.1 35.0*  MCV 84.6 87.1  PLT 266 219   Basic Metabolic Panel: Recent Labs  Lab 08/01/23 1544 08/02/23 0349  NA 135 136  K 3.9 3.8  CL 97* 103  CO2 24 24  GLUCOSE 106* 151*  BUN 9 8  CREATININE 0.67 0.68  CALCIUM 9.5 8.4*   GFR: Estimated  Creatinine Clearance: 61.1 mL/min (by C-G formula based on SCr of 0.68 mg/dL). Liver Function Tests: Recent Labs  Lab 08/01/23 1544 08/02/23 0349  AST 24 17  ALT 15 12  ALKPHOS 105 59  BILITOT 0.3 0.3  PROT 8.0 6.4*  ALBUMIN 3.9 2.5*   Recent Labs  Lab 08/01/23 1544  LIPASE 53*   No results for input(s): AMMONIA in the last 168 hours. Coagulation Profile: No results for input(s): INR, PROTIME in the last 168 hours. Cardiac Enzymes: No results for input(s): CKTOTAL, CKMB, CKMBINDEX, TROPONINI in the last 168 hours. BNP (last 3 results) No results for input(s): PROBNP in the last 8760 hours. HbA1C: No results for input(s): HGBA1C in the last 72 hours. CBG: No results for input(s): GLUCAP in the last 168 hours. Lipid Profile: No results for input(s): CHOL, HDL, LDLCALC, TRIG, CHOLHDL, LDLDIRECT in the last 72 hours. Thyroid  Function Tests: No results for input(s): TSH, T4TOTAL, FREET4, T3FREE, THYROIDAB in the last 72 hours. Anemia Panel: No results for input(s): VITAMINB12, FOLATE, FERRITIN, TIBC, IRON, RETICCTPCT in the last 72 hours. Sepsis Labs: Recent Labs  Lab 08/01/23 1850 08/01/23 2030  LATICACIDVEN 1.2 1.4    Recent Results (from the past 240 hours)  Culture, blood (Routine X 2) w Reflex to ID Panel     Status: None (Preliminary result)   Collection Time: 08/01/23  6:50 PM   Specimen: BLOOD  Result Value Ref Range Status   Specimen Description   Final    BLOOD BLOOD LEFT WRIST Performed at [facility], [address], [place], [place] [address]    Special Requests   Final    Blood Culture results may not be optimal due to an inadequate volume of blood received in culture bottles Performed at [facility], [address] [place], [place] [address]    Culture   Final    NO GROWTH < 12 HOURS Performed at [facility], [address] [place], [place]  [address]    Report Status PENDING  Incomplete  Culture, blood (Routine X 2) w Reflex to ID Panel     Status: None (Preliminary result)   Collection Time: 08/01/23  8:30 PM   Specimen: BLOOD  Result Value Ref Range Status   Specimen Description BLOOD SITE NOT SPECIFIED  Final   Special Requests   Final    BOTTLES DRAWN AEROBIC AND ANAEROBIC Blood Culture adequate volume   Culture   Final    NO GROWTH < 12 HOURS Performed at [facility], [address], [place], [place] [address]    Report Status PENDING  Incomplete         Radiology Studies: CT ABDOMEN PELVIS W CONTRAST Result Date: 08/01/2023 CLINICAL DATA:  Abdominal pain, acute, nonlocalized  LLQ abdominal pain EXAM: CT ABDOMEN AND PELVIS WITH CONTRAST TECHNIQUE: Multidetector CT imaging of the abdomen and pelvis was performed using the standard protocol following bolus administration of intravenous contrast. RADIATION DOSE REDUCTION: This exam was performed according to the departmental dose-optimization program which includes automated exposure control, adjustment of the mA and/or kV according to patient size and/or use of iterative reconstruction technique. CONTRAST:  80mL OMNIPAQUE IOHEXOL 300 MG/ML  SOLN COMPARISON:  None Available. FINDINGS: Lower chest: Tiny hiatal hernia.  No acute abnormality. Hepatobiliary: Subcentimeter pericentimeter hypodensities of the liver likely hepatic cysts. No gallstones, gallbladder wall thickening, or pericholecystic fluid. No biliary dilatation. Pancreas: No focal lesion. Normal pancreatic contour. No surrounding inflammatory changes. Enlarged proximal main pancreatic duct measuring up to 0.9 cm. Spleen: Normal in size without focal abnormality. Adrenals/Urinary Tract: No adrenal nodule bilaterally. Bilateral kidneys enhance symmetrically. No hydronephrosis. No hydroureter. The urinary bladder is unremarkable. Stomach/Bowel: Stomach is within normal limits. No evidence of small bowel wall thickening  or dilatation. Colonic diverticulosis. Long segment mid sigmoid bowel wall thickening with pericolonic fat stranding and suggestion of a developing intramural 1.9 cm abscess formation. Appendix appears normal. Vascular/Lymphatic: No abdominal aorta or iliac aneurysm. Severe atherosclerotic plaque of the aorta and its branches. No abdominal, pelvic, or inguinal lymphadenopathy. Reproductive: Status post hysterectomy. No adnexal masses. Other: Query trace free gas along the superior aspect of the colon (4:52) suggestive of micro perforation. No organized fluid collection. Musculoskeletal: No abdominal wall hernia or abnormality. No suspicious lytic or blastic osseous lesions. No acute displaced fracture. Right pubic symphysis sclerosis. IMPRESSION: 1. Colonic diverticulosis with long segment mid sigmoid colon acute diverticulitis. Suggestion of a developing intramural 1.9 cm abscess formation and micro perforation. Recommend colonoscopy status post treatment and status post complete resolution of inflammatory changes to exclude an underlying lesion. 2. Enlarged main pancreatic duct dilatation (0.9 cm). Query underlying IPMN versus obstructive mass proximally. When the patient is clinically stable and able to follow directions and hold their breath (preferably as an outpatient) further evaluation with dedicated abdominal MRI pancreatic protocol should be considered. 3. Tiny hiatal hernia. 4.  Aortic Atherosclerosis (ICD10-I70.0). Electronically Signed   By: Morgane  Naveau M.D.   On: 08/01/2023 17:13           LOS: 1 day   Time spent= 35 mins    Maggie Schooner, MD [facility]  If 7PM-7AM, please contact night-coverage  08/02/2023, 1:46 PM

## 2023-08-02 NOTE — TOC CM/SW Note (Signed)
 Transition of Care [facility]) - Inpatient Brief Assessment   Patient Details  Name: Judy Donovan MRN: [id number] Date of Birth: Jan 23, 1951  [facility] CM/SW Contact:    Juliane Och, LCSW Phone Number: 08/02/2023, 1:40 PM   Clinical Narrative:  1:40 PM Per chart review, patient resides at home. Patient has a PCP and insurance. Patient does not have SNF/HH/DME history. Patient's preferred pharmacy is [facility] [phone] [place]. No TOC needs were identified at this time. TOC will continue to follow and be available to assist.  Transition of Care Asessment: Insurance and Status: Insurance coverage has been reviewed Patient has primary care physician: Yes Home environment has been reviewed: Private Residence Prior level of function:: N/A Prior/Current Home Services: No current home services Social Drivers of Health Review: SDOH reviewed no interventions necessary Readmission risk has been reviewed: Yes Transition of care needs: no transition of care needs at this time

## 2023-08-03 ENCOUNTER — Encounter (HOSPITAL_COMMUNITY): Payer: Self-pay | Admitting: Family Medicine

## 2023-08-03 DIAGNOSIS — K5700 Diverticulitis of small intestine with perforation and abscess without bleeding: Secondary | ICD-10-CM

## 2023-08-03 LAB — CBC
HCT: 36 % (ref 36.0–46.0)
Hemoglobin: 11.9 g/dL — ABNORMAL LOW (ref 12.0–15.0)
MCH: 28.5 pg (ref 26.0–34.0)
MCHC: 33.1 g/dL (ref 30.0–36.0)
MCV: 86.1 fL (ref 80.0–100.0)
Platelets: 232 10*3/uL (ref 150–400)
RBC: 4.18 MIL/uL (ref 3.87–5.11)
RDW: 11.8 % (ref 11.5–15.5)
WBC: 11.8 10*3/uL — ABNORMAL HIGH (ref 4.0–10.5)
nRBC: 0 % (ref 0.0–0.2)

## 2023-08-03 LAB — BASIC METABOLIC PANEL WITH GFR
Anion gap: 11 (ref 5–15)
BUN: 8 mg/dL (ref 8–23)
CO2: 23 mmol/L (ref 22–32)
Calcium: 8.6 mg/dL — ABNORMAL LOW (ref 8.9–10.3)
Chloride: 103 mmol/L (ref 98–111)
Creatinine, Ser: 0.59 mg/dL (ref 0.44–1.00)
GFR, Estimated: >60 mL/min (ref >60)
Glucose, Bld: 121 mg/dL — ABNORMAL HIGH (ref 70–99)
Potassium: 3.7 mmol/L (ref 3.5–5.1)
Sodium: 137 mmol/L (ref 135–145)

## 2023-08-03 LAB — PHOSPHORUS: Phosphorus: 3.3 mg/dL (ref 2.5–4.6)

## 2023-08-03 LAB — GLUCOSE, CAPILLARY: Glucose-Capillary: 135 mg/dL — ABNORMAL HIGH (ref 70–99)

## 2023-08-03 LAB — MAGNESIUM: Magnesium: 1.9 mg/dL (ref 1.7–2.4)

## 2023-08-03 MED ORDER — POLYETHYLENE GLYCOL 3350 17 G PO PACK
17.0000 g | PACK | Freq: Every day | ORAL | Status: DC
  Administered 2023-08-03 – 2023-08-04 (×2): 17 g via ORAL
  Filled 2023-08-03 (×2): qty 1

## 2023-08-03 NOTE — Progress Notes (Signed)
 Gave report to RN getting patient. Patient will be going to bed 5 on 6 N. She has all of her belongings and is ready to be transferred to the unit.

## 2023-08-03 NOTE — Plan of Care (Signed)
 Pt A&Ox4, but perseverating verbalizations and behaviors. No BM this shift, gas (+).     Problem: Education: Goal: Knowledge of General Education information will improve Description: Including pain rating scale, medication(s)/side effects and non-pharmacologic comfort measures Outcome: Progressing   Problem: Health Behavior/Discharge Planning: Goal: Ability to manage health-related needs will improve Outcome: Progressing   Problem: Clinical Measurements: Goal: Cardiovascular complication will be avoided Outcome: Progressing   Problem: Nutrition: Goal: Adequate nutrition will be maintained Outcome: Progressing   Problem: Coping: Goal: Level of anxiety will decrease Outcome: Progressing

## 2023-08-03 NOTE — Progress Notes (Signed)
 PROGRESS NOTE    Judy Donovan  WGN:[id number] DOB: 1950/03/27 DOA: 08/01/2023 PCP: Ronna Coho, MD    Brief Narrative:  72 y.o. female with medical history significant of GERD, history of internal/external hemorrhoids, osteoporosis, cataracts, hyperlipidemia, history of breast cancer who was seen at the [facility] after presenting with abdominal pain.  Was diagnosed with diverticulitis on May 26 started on Cipro Flagyl later changed to Augmentin  but due to worsening pain comes back to the hospital.  CT abdomen pelvis shows acute diverticulitis with intramural 1.9 cm perforation/abscess.   Assessment & Plan:  Principal Problem:   Diverticulitis of intestine with perforation and abscess Active Problems:   Hyperlipidemia   Malignant neoplasm of upper-outer quadrant of right breast in female, estrogen receptor positive (HCC)   Acute sigmoid diverticulitis with perforation, 1.9 cm abscess Currently on IV Rocephin and Flagyl.  Discussed with IR.  Will proceed with conservative management at this time.  Anticipate total 14-21-day antibiotic course and repeat scan at that time.  WBC improving  Dilated pancreatic duct - Recommend outpatient MRCP and GI follow-up   Hyperlipidemia  Will resume home regimen when stable   Right-sided breast abscess  In remission.  Continue per her oncologist.   Elevated blood pressure  no history of hypertension.  Continue to monitor. IV as needed   DVT prophylaxis: SCDs Start: 08/01/23 1956    Code Status: Full Code Family Communication: None Status is: Inpatient Continue hospital stay for IV antibiotics.  Hopefully discharge in 24-48 hours    Subjective: Sitting up in the chair, feeling better today.  Examination:  General exam: Appears calm and comfortable  Respiratory system: Clear to auscultation. Respiratory effort normal. Cardiovascular system: S1 & S2 heard, RRR. No JVD, murmurs, rubs, gallops or clicks. No pedal  edema. Gastrointestinal system: Abdomen is nondistended, soft and nontender. No organomegaly or masses felt. Normal bowel sounds heard. Central nervous system: Alert and oriented. No focal neurological deficits. Extremities: Symmetric 5 x 5 power. Skin: No rashes, lesions or ulcers Psychiatry: Judgement and insight appear normal. Mood & affect appropriate.                Diet Orders (From admission, onward)     Start     Ordered   08/03/23 0924  Diet clear liquid Room service appropriate? No; Fluid consistency: Thin  Diet effective now       Question Answer Comment  Room service appropriate? No   Fluid consistency: Thin      08/03/23 0924            Objective: Vitals:   08/02/23 2304 08/03/23 0324 08/03/23 0440 08/03/23 0700  BP: (!) 144/82 (!) 148/85  133/83  Pulse: 81 82 79   Resp: 20 19 20 16   Temp: 98.3 F (36.8 C) 98.2 F (36.8 C)  98.1 F (36.7 C)  TempSrc: Oral Oral  Oral  SpO2: 97% 97% 92%   Weight:      Height:        Intake/Output Summary (Last 24 hours) at 08/03/2023 1043 Last data filed at 08/03/2023 0405 Gross per 24 hour  Intake 2085.44 ml  Output --  Net 2085.44 ml   Filed Weights   08/01/23 1530 08/01/23 1945  Weight: 71.7 kg 71.2 kg    Scheduled Meds:  pantoprazole (PROTONIX) IV  40 mg Intravenous Q24H   polyethylene glycol  17 g Oral Daily   Continuous Infusions:  ceFEPime (MAXIPIME) IV 2 g (08/03/23 0627)   dextrose  5%  lactated ringers  100 mL/hr at 08/02/23 2257   metronidazole 500 mg (08/03/23 0323)    Nutritional status     Body mass index is 26.11 kg/m.  Data Reviewed:   CBC: Recent Labs  Lab 08/01/23 1544 08/02/23 0349 08/03/23 0834  WBC 12.0* 14.2* 11.8*  HGB 13.2 11.6* 11.9*  HCT 39.1 35.0* 36.0  MCV 84.6 87.1 86.1  PLT 266 219 232   Basic Metabolic Panel: Recent Labs  Lab 08/01/23 1544 08/02/23 0349 08/03/23 0850  NA 135 136 137  K 3.9 3.8 3.7  CL 97* 103 103  CO2 24 24 23   GLUCOSE 106* 151*  121*  BUN 9 8 8   CREATININE 0.67 0.68 0.59  CALCIUM 9.5 8.4* 8.6*  MG  --   --  1.9  PHOS  --   --  3.3   GFR: Estimated Creatinine Clearance: 61.1 mL/min (by C-G formula based on SCr of 0.59 mg/dL). Liver Function Tests: Recent Labs  Lab 08/01/23 1544 08/02/23 0349  AST 24 17  ALT 15 12  ALKPHOS 105 59  BILITOT 0.3 0.3  PROT 8.0 6.4*  ALBUMIN 3.9 2.5*   Recent Labs  Lab 08/01/23 1544  LIPASE 53*   No results for input(s): AMMONIA in the last 168 hours. Coagulation Profile: No results for input(s): INR, PROTIME in the last 168 hours. Cardiac Enzymes: No results for input(s): CKTOTAL, CKMB, CKMBINDEX, TROPONINI in the last 168 hours. BNP (last 3 results) No results for input(s): PROBNP in the last 8760 hours. HbA1C: No results for input(s): HGBA1C in the last 72 hours. CBG: Recent Labs  Lab 08/03/23 0808  GLUCAP 135*   Lipid Profile: No results for input(s): CHOL, HDL, LDLCALC, TRIG, CHOLHDL, LDLDIRECT in the last 72 hours. Thyroid  Function Tests: No results for input(s): TSH, T4TOTAL, FREET4, T3FREE, THYROIDAB in the last 72 hours. Anemia Panel: No results for input(s): VITAMINB12, FOLATE, FERRITIN, TIBC, IRON, RETICCTPCT in the last 72 hours. Sepsis Labs: Recent Labs  Lab 08/01/23 1850 08/01/23 2030  LATICACIDVEN 1.2 1.4    Recent Results (from the past 240 hours)  Culture, blood (Routine X 2) w Reflex to ID Panel     Status: None (Preliminary result)   Collection Time: 08/01/23  6:50 PM   Specimen: BLOOD  Result Value Ref Range Status   Specimen Description   Final    BLOOD BLOOD LEFT WRIST Performed at [facility], [address], [place], [place] [address]    Special Requests   Final    Blood Culture results may not be optimal due to an inadequate volume of blood received in culture bottles Performed at [facility], [address] [place], [place]  [address]    Culture   Final    NO GROWTH 2 DAYS Performed at [facility], [address] [place], [place] [address]    Report Status PENDING  Incomplete  Culture, blood (Routine X 2) w Reflex to ID Panel     Status: None (Preliminary result)   Collection Time: 08/01/23  8:30 PM   Specimen: BLOOD  Result Value Ref Range Status   Specimen Description BLOOD SITE NOT SPECIFIED  Final   Special Requests   Final    BOTTLES DRAWN AEROBIC AND ANAEROBIC Blood Culture adequate volume   Culture   Final    NO GROWTH 2 DAYS Performed at [facility], [address] [place], [place] [address]    Report Status PENDING  Incomplete  Radiology Studies: CT ABDOMEN PELVIS W CONTRAST Result Date: 08/01/2023 CLINICAL DATA:  Abdominal pain, acute, nonlocalized LLQ abdominal pain EXAM: CT ABDOMEN AND PELVIS WITH CONTRAST TECHNIQUE: Multidetector CT imaging of the abdomen and pelvis was performed using the standard protocol following bolus administration of intravenous contrast. RADIATION DOSE REDUCTION: This exam was performed according to the departmental dose-optimization program which includes automated exposure control, adjustment of the mA and/or kV according to patient size and/or use of iterative reconstruction technique. CONTRAST:  80mL OMNIPAQUE IOHEXOL 300 MG/ML  SOLN COMPARISON:  None Available. FINDINGS: Lower chest: Tiny hiatal hernia.  No acute abnormality. Hepatobiliary: Subcentimeter pericentimeter hypodensities of the liver likely hepatic cysts. No gallstones, gallbladder wall thickening, or pericholecystic fluid. No biliary dilatation. Pancreas: No focal lesion. Normal pancreatic contour. No surrounding inflammatory changes. Enlarged proximal main pancreatic duct measuring up to 0.9 cm. Spleen: Normal in size without focal abnormality. Adrenals/Urinary Tract: No adrenal nodule bilaterally. Bilateral kidneys enhance symmetrically. No hydronephrosis. No hydroureter. The urinary  bladder is unremarkable. Stomach/Bowel: Stomach is within normal limits. No evidence of small bowel wall thickening or dilatation. Colonic diverticulosis. Long segment mid sigmoid bowel wall thickening with pericolonic fat stranding and suggestion of a developing intramural 1.9 cm abscess formation. Appendix appears normal. Vascular/Lymphatic: No abdominal aorta or iliac aneurysm. Severe atherosclerotic plaque of the aorta and its branches. No abdominal, pelvic, or inguinal lymphadenopathy. Reproductive: Status post hysterectomy. No adnexal masses. Other: Query trace free gas along the superior aspect of the colon (4:52) suggestive of micro perforation. No organized fluid collection. Musculoskeletal: No abdominal wall hernia or abnormality. No suspicious lytic or blastic osseous lesions. No acute displaced fracture. Right pubic symphysis sclerosis. IMPRESSION: 1. Colonic diverticulosis with long segment mid sigmoid colon acute diverticulitis. Suggestion of a developing intramural 1.9 cm abscess formation and micro perforation. Recommend colonoscopy status post treatment and status post complete resolution of inflammatory changes to exclude an underlying lesion. 2. Enlarged main pancreatic duct dilatation (0.9 cm). Query underlying IPMN versus obstructive mass proximally. When the patient is clinically stable and able to follow directions and hold their breath (preferably as an outpatient) further evaluation with dedicated abdominal MRI pancreatic protocol should be considered. 3. Tiny hiatal hernia. 4.  Aortic Atherosclerosis (ICD10-I70.0). Electronically Signed   By: Morgane  Naveau M.D.   On: 08/01/2023 17:13           LOS: 2 days   Time spent= 35 mins    Maggie Schooner, MD [facility]  If 7PM-7AM, please contact night-coverage  08/03/2023, 10:43 AM

## 2023-08-03 NOTE — Progress Notes (Signed)
 Subjective: Having some back, shoulder pain from the bed.  Not a great night.  Abdominal pain seems improved, but she says she really can't tell.  ROS: See above, otherwise other systems negative  Objective: Vital signs in last 24 hours: Temp:  [97.9 F (36.6 C)-98.3 F (36.8 C)] 98.1 F (36.7 C) (06/17 0700) Pulse Rate:  [72-85] 79 (06/17 0440) Resp:  [15-20] 16 (06/17 0700) BP: (110-148)/(57-85) 133/83 (06/17 0700) SpO2:  [92 %-97 %] 92 % (06/17 0440) Last BM Date : 08/01/23  Intake/Output from previous day: 06/16 0701 - 06/17 0700 In: 2085.4 [I.V.:1715.6; IV Piggyback:369.9] Out: -  Intake/Output this shift: No intake/output data recorded.  PE: Gen: sitting up in her chair in NAD Heart: regular Lungs: CTAB Abd: soft, minimal LLQ tenderness, ND  Lab Results:  Recent Labs    08/01/23 1544 08/02/23 0349  WBC 12.0* 14.2*  HGB 13.2 11.6*  HCT 39.1 35.0*  PLT 266 219   BMET Recent Labs    08/01/23 1544 08/02/23 0349  NA 135 136  K 3.9 3.8  CL 97* 103  CO2 24 24  GLUCOSE 106* 151*  BUN 9 8  CREATININE 0.67 0.68  CALCIUM 9.5 8.4*   PT/INR No results for input(s): LABPROT, INR in the last 72 hours. CMP     Component Value Date/Time   NA 136 08/02/2023 0349   NA 141 07/28/2021 0840   K 3.8 08/02/2023 0349   CL 103 08/02/2023 0349   CO2 24 08/02/2023 0349   GLUCOSE 151 (H) 08/02/2023 0349   BUN 8 08/02/2023 0349   BUN 12 07/28/2021 0840   CREATININE 0.68 08/02/2023 0349   CREATININE 0.92 08/27/2021 1233   CREATININE 0.72 02/12/2014 1637   CALCIUM 8.4 (L) 08/02/2023 0349   PROT 6.4 (L) 08/02/2023 0349   PROT 7.0 07/28/2021 0840   ALBUMIN 2.5 (L) 08/02/2023 0349   ALBUMIN 3.9 07/28/2021 0840   AST 17 08/02/2023 0349   AST 25 08/27/2021 1233   ALT 12 08/02/2023 0349   ALT 26 08/27/2021 1233   ALKPHOS 59 08/02/2023 0349   BILITOT 0.3 08/02/2023 0349   BILITOT 0.4 08/27/2021 1233   GFRNONAA >60 08/02/2023 0349   GFRNONAA >60  08/27/2021 1233   Lipase     Component Value Date/Time   LIPASE 53 (H) 08/01/2023 1544       Studies/Results: CT ABDOMEN PELVIS W CONTRAST Result Date: 08/01/2023 CLINICAL DATA:  Abdominal pain, acute, nonlocalized LLQ abdominal pain EXAM: CT ABDOMEN AND PELVIS WITH CONTRAST TECHNIQUE: Multidetector CT imaging of the abdomen and pelvis was performed using the standard protocol following bolus administration of intravenous contrast. RADIATION DOSE REDUCTION: This exam was performed according to the departmental dose-optimization program which includes automated exposure control, adjustment of the mA and/or kV according to patient size and/or use of iterative reconstruction technique. CONTRAST:  80mL OMNIPAQUE IOHEXOL 300 MG/ML  SOLN COMPARISON:  None Available. FINDINGS: Lower chest: Tiny hiatal hernia.  No acute abnormality. Hepatobiliary: Subcentimeter pericentimeter hypodensities of the liver likely hepatic cysts. No gallstones, gallbladder wall thickening, or pericholecystic fluid. No biliary dilatation. Pancreas: No focal lesion. Normal pancreatic contour. No surrounding inflammatory changes. Enlarged proximal main pancreatic duct measuring up to 0.9 cm. Spleen: Normal in size without focal abnormality. Adrenals/Urinary Tract: No adrenal nodule bilaterally. Bilateral kidneys enhance symmetrically. No hydronephrosis. No hydroureter. The urinary bladder is unremarkable. Stomach/Bowel: Stomach is within normal limits. No evidence of small bowel wall thickening or dilatation. Colonic  diverticulosis. Long segment mid sigmoid bowel wall thickening with pericolonic fat stranding and suggestion of a developing intramural 1.9 cm abscess formation. Appendix appears normal. Vascular/Lymphatic: No abdominal aorta or iliac aneurysm. Severe atherosclerotic plaque of the aorta and its branches. No abdominal, pelvic, or inguinal lymphadenopathy. Reproductive: Status post hysterectomy. No adnexal masses. Other:  Query trace free gas along the superior aspect of the colon (4:52) suggestive of micro perforation. No organized fluid collection. Musculoskeletal: No abdominal wall hernia or abnormality. No suspicious lytic or blastic osseous lesions. No acute displaced fracture. Right pubic symphysis sclerosis. IMPRESSION: 1. Colonic diverticulosis with long segment mid sigmoid colon acute diverticulitis. Suggestion of a developing intramural 1.9 cm abscess formation and micro perforation. Recommend colonoscopy status post treatment and status post complete resolution of inflammatory changes to exclude an underlying lesion. 2. Enlarged main pancreatic duct dilatation (0.9 cm). Query underlying IPMN versus obstructive mass proximally. When the patient is clinically stable and able to follow directions and hold their breath (preferably as an outpatient) further evaluation with dedicated abdominal MRI pancreatic protocol should be considered. 3. Tiny hiatal hernia. 4.  Aortic Atherosclerosis (ICD10-I70.0). Electronically Signed   By: Morgane  Naveau M.D.   On: 08/01/2023 17:13    Anti-infectives: Anti-infectives (From admission, onward)    Start     Dose/Rate Route Frequency Ordered Stop   08/02/23 1700  cefTRIAXone (ROCEPHIN) 2 g in sodium chloride  0.9 % 100 mL IVPB  Status:  Discontinued        2 g 200 mL/hr over 30 Minutes Intravenous Every 24 hours 08/01/23 2026 08/02/23 1237   08/02/23 1430  ceFEPIme (MAXIPIME) 2 g in sodium chloride  0.9 % 100 mL IVPB        2 g 200 mL/hr over 30 Minutes Intravenous Every 8 hours 08/02/23 1335     08/02/23 1430  metroNIDAZOLE (FLAGYL) IVPB 500 mg        500 mg 100 mL/hr over 60 Minutes Intravenous Every 12 hours 08/02/23 1335     08/02/23 0630  metroNIDAZOLE (FLAGYL) IVPB 500 mg  Status:  Discontinued        500 mg 100 mL/hr over 60 Minutes Intravenous Every 12 hours 08/01/23 2125 08/02/23 1237   08/01/23 2045  cefTRIAXone (ROCEPHIN) 1 g in sodium chloride  0.9 % 100 mL IVPB   Status:  Discontinued        1 g 200 mL/hr over 30 Minutes Intravenous Every 24 hours 08/01/23 1956 08/01/23 2026   08/01/23 2045  metroNIDAZOLE (FLAGYL) IVPB 500 mg  Status:  Discontinued        500 mg 100 mL/hr over 60 Minutes Intravenous Every 12 hours 08/01/23 1956 08/01/23 2125   08/01/23 1730  cefTRIAXone (ROCEPHIN) 2 g in sodium chloride  0.9 % 100 mL IVPB       Placed in And Linked Group   2 g 200 mL/hr over 30 Minutes Intravenous  Once 08/01/23 1717 08/01/23 1801   08/01/23 1730  metroNIDAZOLE (FLAGYL) IVPB 500 mg       Placed in And Linked Group   500 mg 100 mL/hr over 60 Minutes Intravenous  Once 08/01/23 1717 08/01/23 1922        Assessment/Plan Diverticulitis with intramural abscess -seems improving.  Most of her pain overnight seems to be more related to the bed, her back and shoulders, and not her abdomen. -will trial CLD today -labs have been drawn and are pending, follow WBC -AF, VSS -cont abx therapy and conservative management -will need outpatient  colonoscopy in 6-8 weeks -add miralax due to constipation issues -cont to monitor   FEN - CLD, IVFs per primary VTE - ok for chemical prophylaxis from our standpoint ID - Cipro/Flagyl  I reviewed hospitalist notes, last 24 h vitals and pain scores, last 48 h intake and output, last 24 h labs and trends, and last 24 h imaging results.   LOS: 2 days    Leone Ralphs , [facility] 08/03/2023, 9:15 AM Please see Amion for pager number during day hours 7:00am-4:30pm or 7:00am -11:30am on weekends

## 2023-08-03 NOTE — Progress Notes (Signed)
 Patient arrived to 6 north room 5 alert and oriented x4. Pt ambulated from wheelchair to recliner in room with stand by assist with no issues. Pain level currently 0/10. No skin issues. Chair locked. Call light in reach. All needs met at this time.

## 2023-08-04 ENCOUNTER — Other Ambulatory Visit (HOSPITAL_COMMUNITY): Payer: Self-pay

## 2023-08-04 DIAGNOSIS — K5700 Diverticulitis of small intestine with perforation and abscess without bleeding: Secondary | ICD-10-CM | POA: Diagnosis not present

## 2023-08-04 LAB — BASIC METABOLIC PANEL WITH GFR
Anion gap: 9 (ref 5–15)
BUN: <5 mg/dL — ABNORMAL LOW (ref 8–23)
CO2: 23 mmol/L (ref 22–32)
Calcium: 8.3 mg/dL — ABNORMAL LOW (ref 8.9–10.3)
Chloride: 108 mmol/L (ref 98–111)
Creatinine, Ser: 0.55 mg/dL (ref 0.44–1.00)
GFR, Estimated: >60 mL/min (ref >60)
Glucose, Bld: 104 mg/dL — ABNORMAL HIGH (ref 70–99)
Potassium: 3.4 mmol/L — ABNORMAL LOW (ref 3.5–5.1)
Sodium: 140 mmol/L (ref 135–145)

## 2023-08-04 LAB — CBC
HCT: 34.1 % — ABNORMAL LOW (ref 36.0–46.0)
Hemoglobin: 11.3 g/dL — ABNORMAL LOW (ref 12.0–15.0)
MCH: 28.5 pg (ref 26.0–34.0)
MCHC: 33.1 g/dL (ref 30.0–36.0)
MCV: 85.9 fL (ref 80.0–100.0)
Platelets: 213 10*3/uL (ref 150–400)
RBC: 3.97 MIL/uL (ref 3.87–5.11)
RDW: 11.7 % (ref 11.5–15.5)
WBC: 5.3 10*3/uL (ref 4.0–10.5)
nRBC: 0 % (ref 0.0–0.2)

## 2023-08-04 LAB — MAGNESIUM: Magnesium: 2 mg/dL (ref 1.7–2.4)

## 2023-08-04 MED ORDER — POLYETHYLENE GLYCOL 3350 17 G PO PACK: 17.0000 g | PACK | Freq: Every day | ORAL | Status: AC

## 2023-08-04 MED ORDER — POTASSIUM CHLORIDE CRYS ER 20 MEQ PO TBCR
40.0000 meq | EXTENDED_RELEASE_TABLET | Freq: Once | ORAL | Status: AC
  Administered 2023-08-04: 40 meq via ORAL
  Filled 2023-08-04: qty 2

## 2023-08-04 MED ORDER — CIPROFLOXACIN HCL 500 MG PO TABS
500.0000 mg | ORAL_TABLET | Freq: Two times a day (BID) | ORAL | 0 refills | Status: AC
  Filled 2023-08-04: qty 14, 7d supply, fill #0

## 2023-08-04 MED ORDER — POTASSIUM CHLORIDE 10 MEQ/100ML IV SOLN
10.0000 meq | INTRAVENOUS | Status: AC
  Administered 2023-08-04 (×2): 10 meq via INTRAVENOUS
  Filled 2023-08-04 (×2): qty 100

## 2023-08-04 MED ORDER — METRONIDAZOLE 500 MG PO TABS
500.0000 mg | ORAL_TABLET | Freq: Two times a day (BID) | ORAL | 0 refills | Status: AC
  Filled 2023-08-04: qty 14, 7d supply, fill #0

## 2023-08-04 NOTE — Discharge Summary (Signed)
 Physician Discharge Summary  Judy Donovan WGN:[id number] DOB: 1950/05/29 DOA: 08/01/2023  PCP: Ronna Coho, MD  Admit date: 08/01/2023 Discharge date: 08/04/2023  Admitted From: Home Disposition: Home  Recommendations for Outpatient Follow-up:  Follow up with PCP in 1-2 weeks Please obtain BMP/CBC in one week your next doctors visit.  7 days of Cipro Flagyl Outpatient colonoscopy in 6-8 weeks   Discharge Condition: Stable CODE STATUS: Full code Diet recommendation: Low fiber  Brief/Interim Summary: Brief Narrative:  73 y.o. female with medical history significant of GERD, history of internal/external hemorrhoids, osteoporosis, cataracts, hyperlipidemia, history of breast cancer who was seen at the [facility] after presenting with abdominal pain.  Was diagnosed with diverticulitis on May 26 started on Cipro Flagyl later changed to Augmentin  but due to worsening pain comes back to the hospital.  CT abdomen pelvis shows acute diverticulitis with intramural 1.9 cm perforation/abscess. Patient did significantly well with 48 hours of IV antibiotics and white count resolved.  She was tolerating p.o. without any issues.  Will transition to p.o. antibiotics for 7 more days to complete total 10-day course. Medically stable for discharge  Assessment & Plan:  Principal Problem:   Diverticulitis of intestine with perforation and abscess Active Problems:   Hyperlipidemia   Malignant neoplasm of upper-outer quadrant of right breast in female, estrogen receptor positive (HCC)   Acute sigmoid diverticulitis with perforation, 1.9 cm abscess Currently on IV Rocephin and Flagyl.  Discussed with IR.  Significantly improved with conservative management.  Will transition to p.o. Cipro and Flagyl for 7 more days.  Follow-up outpatient PCP.  She will need a colonoscopy in about 6 weeks  Dilated pancreatic duct - Recommend outpatient MRCP and GI follow-up   Hyperlipidemia  Will resume home  regimen when stable   Right-sided breast abscess  In remission.  Continue per her oncologist.   Elevated blood pressure  no history of hypertension.  Continue to monitor. IV as needed   DVT prophylaxis: SCDs Start: 08/01/23 1956    Code Status: Full Code Family Communication: None Status is: Inpatient Continue hospital stay for IV antibiotics.  Hopefully discharge in 24-48 hours    Subjective: Sitting up in the chair, feeling better today.  Examination:  General exam: Appears calm and comfortable  Respiratory system: Clear to auscultation. Respiratory effort normal. Cardiovascular system: S1 & S2 heard, RRR. No JVD, murmurs, rubs, gallops or clicks. No pedal edema. Gastrointestinal system: Abdomen is nondistended, soft and nontender. No organomegaly or masses felt. Normal bowel sounds heard. Central nervous system: Alert and oriented. No focal neurological deficits. Extremities: Symmetric 5 x 5 power. Skin: No rashes, lesions or ulcers Psychiatry: Judgement and insight appear normal. Mood & affect appropriate.    Discharge Diagnoses:  Principal Problem:   Diverticulitis of intestine with perforation and abscess Active Problems:   Hyperlipidemia   Malignant neoplasm of upper-outer quadrant of right breast in female, estrogen receptor positive (HCC)      Discharge Exam: Vitals:   08/04/23 0505 08/04/23 0920  BP: 133/75 130/79  Pulse: 75 76  Resp: 18 17  Temp: (!) 97.5 F (36.4 C)   SpO2:  99%   Vitals:   08/03/23 2127 08/04/23 0015 08/04/23 0505 08/04/23 0920  BP: (!) 143/91 (!) 160/113 133/75 130/79  Pulse: 72 (!) 110 75 76  Resp:  18 18 17   Temp: 98.1 F (36.7 C) (!) 97.5 F (36.4 C) (!) 97.5 F (36.4 C)   TempSrc: Oral Oral Oral   SpO2: 96% 98%  99%  Weight:      Height:          Discharge Instructions   Allergies as of 08/04/2023       Reactions   Prolia  [denosumab ] Other (See Comments)   Makes me feel funny, dizziness   Amoxicillin   Rash   Makes me feel funny        Medication List     TAKE these medications    cetirizine 5 MG tablet Commonly known as: ZYRTEC Take 5 mg by mouth as needed for allergies.   ciprofloxacin 500 MG tablet Commonly known as: Cipro Take 1 tablet (500 mg total) by mouth 2 (two) times daily for 7 days.   escitalopram 10 MG tablet Commonly known as: LEXAPRO Take 10 mg by mouth daily.   metroNIDAZOLE 500 MG tablet Commonly known as: FLAGYL Take 1 tablet (500 mg total) by mouth 2 (two) times daily for 7 days.   polyethylene glycol 17 g packet Commonly known as: MIRALAX / GLYCOLAX Take 17 g by mouth daily.        Follow-up Information     Ronna Coho, MD Follow up in 1 week(s).   Specialty: Family Medicine Contact information: [address] [place] [address] [phone]                Allergies  Allergen Reactions   Prolia  [Denosumab ] Other (See Comments)    Makes me feel funny, dizziness   Amoxicillin  Rash    Makes me feel funny    You were cared for by a hospitalist during your hospital stay. If you have any questions about your discharge medications or the care you received while you were in the hospital after you are discharged, you can call the unit and asked to speak with the hospitalist on call if the hospitalist that took care of you is not available. Once you are discharged, your primary care physician will handle any further medical issues. Please note that no refills for any discharge medications will be authorized once you are discharged, as it is imperative that you return to your primary care physician (or establish a relationship with a primary care physician if you do not have one) for your aftercare needs so that they can reassess your need for medications and monitor your lab values.  You were cared for by a hospitalist during your hospital stay. If you have any questions about your discharge medications or the care  you received while you were in the hospital after you are discharged, you can call the unit and asked to speak with the hospitalist on call if the hospitalist that took care of you is not available. Once you are discharged, your primary care physician will handle any further medical issues. Please note that NO REFILLS for any discharge medications will be authorized once you are discharged, as it is imperative that you return to your primary care physician (or establish a relationship with a primary care physician if you do not have one) for your aftercare needs so that they can reassess your need for medications and monitor your lab values.  Please request your Prim.MD to go over all Hospital Tests and Procedure/Radiological results at the follow up, please get all Hospital records sent to your Prim MD by signing hospital release before you go home.  Get CBC, CMP, 2 view Chest X ray checked  by Primary MD during your next visit or SNF MD in 5-7 days ( we routinely  change or add medications that can affect your baseline labs and fluid status, therefore we recommend that you get the mentioned basic workup next visit with your PCP, your PCP may decide not to get them or add new tests based on their clinical decision)  On your next visit with your primary care physician please Get Medicines reviewed and adjusted.  If you experience worsening of your admission symptoms, develop shortness of breath, life threatening emergency, suicidal or homicidal thoughts you must seek medical attention immediately by calling 911 or calling your MD immediately  if symptoms less severe.  You Must read complete instructions/literature along with all the possible adverse reactions/side effects for all the Medicines you take and that have been prescribed to you. Take any new Medicines after you have completely understood and accpet all the possible adverse reactions/side effects.   Do not drive, operate heavy machinery, perform  activities at heights, swimming or participation in water activities or provide baby sitting services if your were admitted for syncope or siezures until you have seen by Primary MD or a Neurologist and advised to do so again.  Do not drive when taking Pain medications.   Procedures/Studies: CT ABDOMEN PELVIS W CONTRAST Result Date: 08/01/2023 CLINICAL DATA:  Abdominal pain, acute, nonlocalized LLQ abdominal pain EXAM: CT ABDOMEN AND PELVIS WITH CONTRAST TECHNIQUE: Multidetector CT imaging of the abdomen and pelvis was performed using the standard protocol following bolus administration of intravenous contrast. RADIATION DOSE REDUCTION: This exam was performed according to the departmental dose-optimization program which includes automated exposure control, adjustment of the mA and/or kV according to patient size and/or use of iterative reconstruction technique. CONTRAST:  80mL OMNIPAQUE IOHEXOL 300 MG/ML  SOLN COMPARISON:  None Available. FINDINGS: Lower chest: Tiny hiatal hernia.  No acute abnormality. Hepatobiliary: Subcentimeter pericentimeter hypodensities of the liver likely hepatic cysts. No gallstones, gallbladder wall thickening, or pericholecystic fluid. No biliary dilatation. Pancreas: No focal lesion. Normal pancreatic contour. No surrounding inflammatory changes. Enlarged proximal main pancreatic duct measuring up to 0.9 cm. Spleen: Normal in size without focal abnormality. Adrenals/Urinary Tract: No adrenal nodule bilaterally. Bilateral kidneys enhance symmetrically. No hydronephrosis. No hydroureter. The urinary bladder is unremarkable. Stomach/Bowel: Stomach is within normal limits. No evidence of small bowel wall thickening or dilatation. Colonic diverticulosis. Long segment mid sigmoid bowel wall thickening with pericolonic fat stranding and suggestion of a developing intramural 1.9 cm abscess formation. Appendix appears normal. Vascular/Lymphatic: No abdominal aorta or iliac aneurysm.  Severe atherosclerotic plaque of the aorta and its branches. No abdominal, pelvic, or inguinal lymphadenopathy. Reproductive: Status post hysterectomy. No adnexal masses. Other: Query trace free gas along the superior aspect of the colon (4:52) suggestive of micro perforation. No organized fluid collection. Musculoskeletal: No abdominal wall hernia or abnormality. No suspicious lytic or blastic osseous lesions. No acute displaced fracture. Right pubic symphysis sclerosis. IMPRESSION: 1. Colonic diverticulosis with long segment mid sigmoid colon acute diverticulitis. Suggestion of a developing intramural 1.9 cm abscess formation and micro perforation. Recommend colonoscopy status post treatment and status post complete resolution of inflammatory changes to exclude an underlying lesion. 2. Enlarged main pancreatic duct dilatation (0.9 cm). Query underlying IPMN versus obstructive mass proximally. When the patient is clinically stable and able to follow directions and hold their breath (preferably as an outpatient) further evaluation with dedicated abdominal MRI pancreatic protocol should be considered. 3. Tiny hiatal hernia. 4.  Aortic Atherosclerosis (ICD10-I70.0). Electronically Signed   By: Morgane  Naveau M.D.   On: 08/01/2023 17:13  The results of significant diagnostics from this hospitalization (including imaging, microbiology, ancillary and laboratory) are listed below for reference.     Microbiology: Recent Results (from the past 240 hours)  Culture, blood (Routine X 2) w Reflex to ID Panel     Status: None (Preliminary result)   Collection Time: 08/01/23  6:50 PM   Specimen: BLOOD  Result Value Ref Range Status   Specimen Description   Final    BLOOD BLOOD LEFT WRIST Performed at [facility], [address] [place], [place] [address]    Special Requests   Final    Blood Culture results may not be optimal due to an inadequate volume of blood received in culture  bottles Performed at [facility], [address] [place], [place] [address]    Culture   Final    NO GROWTH 3 DAYS Performed at [facility], [address], [place], [place] [address]    Report Status PENDING  Incomplete  Culture, blood (Routine X 2) w Reflex to ID Panel     Status: None (Preliminary result)   Collection Time: 08/01/23  8:30 PM   Specimen: BLOOD  Result Value Ref Range Status   Specimen Description BLOOD SITE NOT SPECIFIED  Final   Special Requests   Final    BOTTLES DRAWN AEROBIC AND ANAEROBIC Blood Culture adequate volume   Culture   Final    NO GROWTH 3 DAYS Performed at [facility], [address], [place], [place] [address]    Report Status PENDING  Incomplete     Labs: BNP (last 3 results) No results for input(s): BNP in the last 8760 hours. Basic Metabolic Panel: Recent Labs  Lab 08/01/23 1544 08/02/23 0349 08/03/23 0850 08/04/23 0549  NA 135 136 137 140  K 3.9 3.8 3.7 3.4*  CL 97* 103 103 108  CO2 24 24 23 23   GLUCOSE 106* 151* 121* 104*  BUN 9 8 8  <5*  CREATININE 0.67 0.68 0.59 0.55  CALCIUM 9.5 8.4* 8.6* 8.3*  MG  --   --  1.9 2.0  PHOS  --   --  3.3  --    Liver Function Tests: Recent Labs  Lab 08/01/23 1544 08/02/23 0349  AST 24 17  ALT 15 12  ALKPHOS 105 59  BILITOT 0.3 0.3  PROT 8.0 6.4*  ALBUMIN 3.9 2.5*   Recent Labs  Lab 08/01/23 1544  LIPASE 53*   No results for input(s): AMMONIA in the last 168 hours. CBC: Recent Labs  Lab 08/01/23 1544 08/02/23 0349 08/03/23 0834 08/04/23 0549  WBC 12.0* 14.2* 11.8* 5.3  HGB 13.2 11.6* 11.9* 11.3*  HCT 39.1 35.0* 36.0 34.1*  MCV 84.6 87.1 86.1 85.9  PLT 266 219 232 213   Cardiac Enzymes: No results for input(s): CKTOTAL, CKMB, CKMBINDEX, TROPONINI in the last 168 hours. BNP: Invalid input(s): POCBNP CBG: Recent Labs  Lab 08/03/23 0808  GLUCAP 135*   D-Dimer No results for input(s): DDIMER in the last 72  hours. Hgb A1c No results for input(s): HGBA1C in the last 72 hours. Lipid Profile No results for input(s): CHOL, HDL, LDLCALC, TRIG, CHOLHDL, LDLDIRECT in the last 72 hours. Thyroid  function studies No results for input(s): TSH, T4TOTAL, T3FREE, THYROIDAB in the last 72 hours.  Invalid input(s): FREET3 Anemia work up No results for input(s): VITAMINB12, FOLATE, FERRITIN, TIBC, IRON, RETICCTPCT in the last 72 hours. Urinalysis    Component Value Date/Time   COLORURINE  YELLOW 08/01/2023 1538   APPEARANCEUR CLEAR 08/01/2023 1538   LABSPEC 1.024 08/01/2023 1538   PHURINE 5.5 08/01/2023 1538   GLUCOSEU NEGATIVE 08/01/2023 1538   HGBUR NEGATIVE 08/01/2023 1538   BILIRUBINUR NEGATIVE 08/01/2023 1538   BILIRUBINUR N 07/24/2014 1014   KETONESUR NEGATIVE 08/01/2023 1538   PROTEINUR NEGATIVE 08/01/2023 1538   UROBILINOGEN negative 07/24/2014 1014   NITRITE NEGATIVE 08/01/2023 1538   LEUKOCYTESUR TRACE (A) 08/01/2023 1538   Sepsis Labs Recent Labs  Lab 08/01/23 1544 08/02/23 0349 08/03/23 0834 08/04/23 0549  WBC 12.0* 14.2* 11.8* 5.3   Microbiology Recent Results (from the past 240 hours)  Culture, blood (Routine X 2) w Reflex to ID Panel     Status: None (Preliminary result)   Collection Time: 08/01/23  6:50 PM   Specimen: BLOOD  Result Value Ref Range Status   Specimen Description   Final    BLOOD BLOOD LEFT WRIST Performed at [facility], [address] [place], [place] [address]    Special Requests   Final    Blood Culture results may not be optimal due to an inadequate volume of blood received in culture bottles Performed at [facility], [address] [place], [place] [address]    Culture   Final    NO GROWTH 3 DAYS Performed at [facility], [address] [place], [place] [address]    Report Status PENDING  Incomplete  Culture, blood (Routine X 2) w Reflex to ID Panel      Status: None (Preliminary result)   Collection Time: 08/01/23  8:30 PM   Specimen: BLOOD  Result Value Ref Range Status   Specimen Description BLOOD SITE NOT SPECIFIED  Final   Special Requests   Final    BOTTLES DRAWN AEROBIC AND ANAEROBIC Blood Culture adequate volume   Culture   Final    NO GROWTH 3 DAYS Performed at [facility], [address] [place], [place] [address]    Report Status PENDING  Incomplete     Time coordinating discharge:  I have spent 35 minutes face to face with the patient and on the ward discussing the patients care, assessment, plan and disposition with other care givers. >50% of the time was devoted counseling the patient about the risks and benefits of treatment/Discharge disposition and coordinating care.   SIGNED:   Maggie Schooner, MD  [facility] 08/04/2023, 12:08 PM   If 7PM-7AM, please contact night-coverage

## 2023-08-04 NOTE — Discharge Instructions (Addendum)
 Low fiber diet x 4 weeks, then transition to high fiber to follow.  Call gastroenterology and seek follow up for a colonoscopy.

## 2023-08-04 NOTE — Progress Notes (Signed)
 Daughter here, IV potassium complete, pt getting dressed, pt will d/c via wheelchair with her belongings, escorted by hospital volunteer/staff.   Shamaine Mulkern,RN SWOT

## 2023-08-04 NOTE — Plan of Care (Signed)

## 2023-08-04 NOTE — Plan of Care (Signed)
  Problem: Education: Goal: Knowledge of General Education information will improve Description: Including pain rating scale, medication(s)/side effects and non-pharmacologic comfort measures Outcome: Completed/Met   Problem: Health Behavior/Discharge Planning: Goal: Ability to manage health-related needs will improve Outcome: Completed/Met   Problem: Clinical Measurements: Goal: Ability to maintain clinical measurements within normal limits will improve Outcome: Completed/Met Goal: Will remain free from infection Outcome: Completed/Met Goal: Diagnostic test results will improve Outcome: Completed/Met Goal: Respiratory complications will improve Outcome: Completed/Met Goal: Cardiovascular complication will be avoided Outcome: Completed/Met   Problem: Activity: Goal: Risk for activity intolerance will decrease Outcome: Completed/Met   Problem: Nutrition: Goal: Adequate nutrition will be maintained Outcome: Completed/Met   Problem: Coping: Goal: Level of anxiety will decrease Outcome: Completed/Met   Problem: Elimination: Goal: Will not experience complications related to bowel motility Outcome: Completed/Met Goal: Will not experience complications related to urinary retention Outcome: Completed/Met   Problem: Pain Managment: Goal: General experience of comfort will improve and/or be controlled Outcome: Completed/Met   Problem: Safety: Goal: Ability to remain free from injury will improve Outcome: Completed/Met   Problem: Skin Integrity: Goal: Risk for impaired skin integrity will decrease Outcome: Completed/Met   Problem: Health Behavior/Discharge Planning: Goal: Ability to manage health-related needs will improve Outcome: Completed/Met

## 2023-08-04 NOTE — Progress Notes (Signed)
 Discharge instructions reviewed with pt. Handouts on diverticulitis, low fiber diet and high fiber diet included in instructions and discussed with pt.  Copy of instructions given to pt. Scripts were sent to [facility] and will be picked up and delivered to the pt while she waits for her IV potassium to complete. (Should finish by 1pm, pt is calling her husband to be here before 1pm to take her home).               Lynisha Osuch,RN SWOT

## 2023-08-04 NOTE — Progress Notes (Addendum)
 Subjective: Had some pain overnight relieved by voiding.  Otherwise abdomen is nontender.  Ate grits this am for breakfast.  Eager to go home.  ROS: See above, otherwise other systems negative  Objective: Vital signs in last 24 hours: Temp:  [97.5 F (36.4 C)-98.9 F (37.2 C)] 97.5 F (36.4 C) (06/18 0505) Pulse Rate:  [72-110] 76 (06/18 0920) Resp:  [16-18] 17 (06/18 0920) BP: (122-160)/(75-113) 130/79 (06/18 0920) SpO2:  [94 %-100 %] 99 % (06/18 0920) Last BM Date : 08/01/23  Intake/Output from previous day: 06/17 0701 - 06/18 0700 In: 1805.7 [P.O.:480; I.V.:994.7; IV Piggyback:330.9] Out: -  Intake/Output this shift: Total I/O In: 120 [P.O.:120] Out: -   PE: Gen: sitting up in her chair in NAD Abd: soft, NT, ND  Lab Results:  Recent Labs    08/03/23 0834 08/04/23 0549  WBC 11.8* 5.3  HGB 11.9* 11.3*  HCT 36.0 34.1*  PLT 232 213   BMET Recent Labs    08/03/23 0850 08/04/23 0549  NA 137 140  K 3.7 3.4*  CL 103 108  CO2 23 23  GLUCOSE 121* 104*  BUN 8 <5*  CREATININE 0.59 0.55  CALCIUM 8.6* 8.3*   PT/INR No results for input(s): LABPROT, INR in the last 72 hours. CMP     Component Value Date/Time   NA 140 08/04/2023 0549   NA 141 07/28/2021 0840   K 3.4 (L) 08/04/2023 0549   CL 108 08/04/2023 0549   CO2 23 08/04/2023 0549   GLUCOSE 104 (H) 08/04/2023 0549   BUN <5 (L) 08/04/2023 0549   BUN 12 07/28/2021 0840   CREATININE 0.55 08/04/2023 0549   CREATININE 0.92 08/27/2021 1233   CREATININE 0.72 02/12/2014 1637   CALCIUM 8.3 (L) 08/04/2023 0549   PROT 6.4 (L) 08/02/2023 0349   PROT 7.0 07/28/2021 0840   ALBUMIN 2.5 (L) 08/02/2023 0349   ALBUMIN 3.9 07/28/2021 0840   AST 17 08/02/2023 0349   AST 25 08/27/2021 1233   ALT 12 08/02/2023 0349   ALT 26 08/27/2021 1233   ALKPHOS 59 08/02/2023 0349   BILITOT 0.3 08/02/2023 0349   BILITOT 0.4 08/27/2021 1233   GFRNONAA >60 08/04/2023 0549   GFRNONAA >60 08/27/2021 1233   Lipase      Component Value Date/Time   LIPASE 53 (H) 08/01/2023 1544       Studies/Results: No results found.   Anti-infectives: Anti-infectives (From admission, onward)    Start     Dose/Rate Route Frequency Ordered Stop   08/02/23 1700  cefTRIAXone (ROCEPHIN) 2 g in sodium chloride  0.9 % 100 mL IVPB  Status:  Discontinued        2 g 200 mL/hr over 30 Minutes Intravenous Every 24 hours 08/01/23 2026 08/02/23 1237   08/02/23 1430  ceFEPIme (MAXIPIME) 2 g in sodium chloride  0.9 % 100 mL IVPB        2 g 200 mL/hr over 30 Minutes Intravenous Every 8 hours 08/02/23 1335     08/02/23 1430  metroNIDAZOLE (FLAGYL) IVPB 500 mg        500 mg 100 mL/hr over 60 Minutes Intravenous Every 12 hours 08/02/23 1335     08/02/23 0630  metroNIDAZOLE (FLAGYL) IVPB 500 mg  Status:  Discontinued        500 mg 100 mL/hr over 60 Minutes Intravenous Every 12 hours 08/01/23 2125 08/02/23 1237   08/01/23 2045  cefTRIAXone (ROCEPHIN) 1 g in sodium chloride  0.9 %  100 mL IVPB  Status:  Discontinued        1 g 200 mL/hr over 30 Minutes Intravenous Every 24 hours 08/01/23 1956 08/01/23 2026   08/01/23 2045  metroNIDAZOLE (FLAGYL) IVPB 500 mg  Status:  Discontinued        500 mg 100 mL/hr over 60 Minutes Intravenous Every 12 hours 08/01/23 1956 08/01/23 2125   08/01/23 1730  cefTRIAXone (ROCEPHIN) 2 g in sodium chloride  0.9 % 100 mL IVPB       Placed in And Linked Group   2 g 200 mL/hr over 30 Minutes Intravenous  Once 08/01/23 1717 08/01/23 1801   08/01/23 1730  metroNIDAZOLE (FLAGYL) IVPB 500 mg       Placed in And Linked Group   500 mg 100 mL/hr over 60 Minutes Intravenous  Once 08/01/23 1717 08/01/23 1922        Assessment/Plan Diverticulitis with intramural abscess -improving and essentially resolved -tolerating FLD, soft diet ordered -WBC normal -AF, VSS -cont abx therapy and conservative management, would treat for 10 days total of abx therapy -will need outpatient colonoscopy in 6-8  weeks -miralax due to constipation issues, discussed diet recommendations of low fiber, then high fiber diet, etc.  All questions were answered -surgically stable to DC home.  Relayed to primary service.  No surgical follow up needed at this time.  FEN - soft VTE - ok for chemical prophylaxis from our standpoint ID - Cipro/Flagyl  I reviewed hospitalist notes, last 24 h vitals and pain scores, last 48 h intake and output, last 24 h labs and trends, and last 24 h imaging results.   LOS: 3 days    Leone Ralphs , [facility] 08/04/2023, 9:32 AM Please see Amion for pager number during day hours 7:00am-4:30pm or 7:00am -11:30am on weekends

## 2023-08-04 NOTE — Care Management Important Message (Signed)
 Important Message  Patient Details  Name: Judy Donovan MRN: [id number] Date of Birth: 10-28-1950   Important Message Given:  Yes - Medicare IM     Felix Host 08/04/2023, 12:16 PM

## 2023-08-04 NOTE — Progress Notes (Signed)
 Pt states her daughter will be the one to pick her up and she will be here about 1pm as IV potassium finishes.   Brisa Auth,RN SWOT

## 2023-08-06 LAB — CULTURE, BLOOD (ROUTINE X 2)
Culture: NO GROWTH
Culture: NO GROWTH
Special Requests: ADEQUATE

## 2023-08-16 DIAGNOSIS — K5792 Diverticulitis of intestine, part unspecified, without perforation or abscess without bleeding: Secondary | ICD-10-CM | POA: Diagnosis not present

## 2023-08-18 ENCOUNTER — Ambulatory Visit
Admission: RE | Admit: 2023-08-18 | Discharge: 2023-08-18 | Disposition: A | Discharge status: Home / Self Care | Payer: Medicare Other | Source: Ambulatory Visit | Attending: Hematology and Oncology | Admitting: Hematology and Oncology

## 2023-08-18 DIAGNOSIS — Z08 Encounter for follow-up examination after completed treatment for malignant neoplasm: Secondary | ICD-10-CM | POA: Diagnosis not present

## 2023-08-18 DIAGNOSIS — Z853 Personal history of malignant neoplasm of breast: Secondary | ICD-10-CM | POA: Diagnosis not present

## 2023-08-18 DIAGNOSIS — Z9889 Other specified postprocedural states: Secondary | ICD-10-CM

## 2023-09-08 DIAGNOSIS — H6121 Impacted cerumen, right ear: Secondary | ICD-10-CM | POA: Diagnosis not present

## 2023-09-08 DIAGNOSIS — H65192 Other acute nonsuppurative otitis media, left ear: Secondary | ICD-10-CM | POA: Diagnosis not present

## 2023-10-11 ENCOUNTER — Ambulatory Visit: Attending: Hematology and Oncology

## 2023-10-11 VITALS — Wt 148.5 lb

## 2023-10-11 DIAGNOSIS — Z483 Aftercare following surgery for neoplasm: Secondary | ICD-10-CM | POA: Insufficient documentation

## 2023-10-11 NOTE — Therapy (Signed)
 OUTPATIENT PHYSICAL THERAPY SOZO SCREENING NOTE   Patient Name: Judy Donovan MRN: [id number] DOB:1950-09-14, 73 y.o., female Today's Date: 10/11/2023  PCP: Kip Righter, MD REFERRING PROVIDER: Odean Potts, MD   PT End of Session - 10/11/23 [phone]     Visit Number 2   # unchanged due to screen only   PT Start Time 0922    PT Stop Time 0926    PT Time Calculation (min) 4 min    Activity Tolerance Patient tolerated treatment well    Behavior During Therapy [facility] for tasks assessed/performed          Past Medical History:  Diagnosis Date   Cataracts, bilateral    GERD (gastroesophageal reflux disease)    History of uterine fibroid    Internal and external hemorrhoids without complication    Osteoporosis    PONV (postoperative nausea and vomiting)    Past Surgical History:  Procedure Laterality Date   BREAST BIOPSY Right 08/21/2021   BREAST LUMPECTOMY Right 09/26/2021   BREAST LUMPECTOMY WITH RADIOACTIVE SEED AND SENTINEL LYMPH NODE BIOPSY Right 09/26/2021   Procedure: RIGHT BREAST RADIOACTIVE SEED LOCALIZED LUMPECTOMY AND SENTINEL NODE BIOPSY;  Surgeon: Curvin Deward MOULD, MD;  Location: [facility];  Service: General;  Laterality: Right;   CATARACT EXTRACTION W/ INTRAOCULAR LENS  IMPLANT, BILATERAL  2015   HEMORRHOID SURGERY N/A 11/19/2015   Procedure: HEMORRHOIDECTOMY;  Surgeon: Bernarda Ned, MD;  Location: [facility];  Service: General;  Laterality: N/A;   TUBAL LIGATION Bilateral 1980's   VAGINAL HYSTERECTOMY  05/12/2004   w/  Bilateral Salpingoophorectomy   Patient Active Problem List   Diagnosis Date Noted   Diverticulitis of intestine with perforation and abscess 08/01/2023   Malignant neoplasm of upper-outer quadrant of right breast in female, estrogen receptor positive (HCC) 08/25/2021   Hyperlipidemia 06/10/2021   Osteoporosis 11/01/2015   Cataracts, bilateral 11/01/2015    REFERRING DIAG: right breast cancer at risk for  lymphedema  THERAPY DIAG: Aftercare following surgery for neoplasm  PERTINENT HISTORY: Screening mammogram on 08/13/2021 detected 73 year old female recalled from screening mammogram dated 08/13/2021 for right breast calcifications. 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 She had a right lumpectomy with SLNB on 09/26/2021 with 0/2 LN. Plan is  antiestrogen therapy  PRECAUTIONS: right UE Lymphedema risk, None  SUBJECTIVE: Pt returns for her last 3 month L-Dex screen.   PAIN:  Are you having pain? No  SOZO SCREENING: Patient was assessed today using the SOZO machine to determine the lymphedema index score. This was compared to her baseline score. It was determined that she is within the recommended range when compared to her baseline and no further action is needed at this time. She will continue SOZO screenings. These are done every 3 months for 2 years post operatively followed by every 6 months for 2 years, and then annually.   L-DEX FLOWSHEETS - 10/11/23 0900       L-DEX LYMPHEDEMA SCREENING   Measurement Type Unilateral    L-DEX MEASUREMENT EXTREMITY Upper Extremity    POSITION  Standing    DOMINANT SIDE Right    At Risk Side Right    BASELINE SCORE (UNILATERAL) 3.4    L-DEX SCORE (UNILATERAL) 4.6    VALUE CHANGE (UNILAT) 1.2  P: Begin 6 month SOZO.   Aden Berwyn Caldron, PTA 10/11/2023, 9:25 AM

## 2023-10-26 DIAGNOSIS — C50411 Malignant neoplasm of upper-outer quadrant of right female breast: Secondary | ICD-10-CM | POA: Diagnosis not present

## 2023-10-26 DIAGNOSIS — Z170 Estrogen receptor positive status [ER+]: Secondary | ICD-10-CM | POA: Diagnosis not present

## 2023-10-27 DIAGNOSIS — Z853 Personal history of malignant neoplasm of breast: Secondary | ICD-10-CM | POA: Diagnosis not present

## 2023-10-27 DIAGNOSIS — E785 Hyperlipidemia, unspecified: Secondary | ICD-10-CM | POA: Diagnosis not present

## 2023-10-27 DIAGNOSIS — R7309 Other abnormal glucose: Secondary | ICD-10-CM | POA: Diagnosis not present

## 2023-10-27 DIAGNOSIS — Z0000 Encounter for general adult medical examination without abnormal findings: Secondary | ICD-10-CM | POA: Diagnosis not present

## 2023-10-27 DIAGNOSIS — M26629 Arthralgia of temporomandibular joint, unspecified side: Secondary | ICD-10-CM | POA: Diagnosis not present

## 2023-10-27 DIAGNOSIS — Z8719 Personal history of other diseases of the digestive system: Secondary | ICD-10-CM | POA: Diagnosis not present

## 2023-10-29 DIAGNOSIS — K5909 Other constipation: Secondary | ICD-10-CM | POA: Diagnosis not present

## 2023-10-29 DIAGNOSIS — K5732 Diverticulitis of large intestine without perforation or abscess without bleeding: Secondary | ICD-10-CM | POA: Diagnosis not present

## 2023-10-29 DIAGNOSIS — Z86018 Personal history of other benign neoplasm: Secondary | ICD-10-CM | POA: Diagnosis not present

## 2023-11-10 DIAGNOSIS — K5732 Diverticulitis of large intestine without perforation or abscess without bleeding: Secondary | ICD-10-CM | POA: Diagnosis not present

## 2023-11-10 DIAGNOSIS — K5730 Diverticulosis of large intestine without perforation or abscess without bleeding: Secondary | ICD-10-CM | POA: Diagnosis not present

## 2023-11-10 DIAGNOSIS — Z09 Encounter for follow-up examination after completed treatment for conditions other than malignant neoplasm: Secondary | ICD-10-CM | POA: Diagnosis not present

## 2023-11-10 DIAGNOSIS — D123 Benign neoplasm of transverse colon: Secondary | ICD-10-CM | POA: Diagnosis not present

## 2023-11-10 DIAGNOSIS — Z860101 Personal history of adenomatous and serrated colon polyps: Secondary | ICD-10-CM | POA: Diagnosis not present

## 2023-11-10 DIAGNOSIS — K649 Unspecified hemorrhoids: Secondary | ICD-10-CM | POA: Diagnosis not present

## 2023-11-12 DIAGNOSIS — D123 Benign neoplasm of transverse colon: Secondary | ICD-10-CM | POA: Diagnosis not present

## 2023-12-15 DIAGNOSIS — C50911 Malignant neoplasm of unspecified site of right female breast: Secondary | ICD-10-CM | POA: Diagnosis not present

## 2023-12-29 DIAGNOSIS — C50911 Malignant neoplasm of unspecified site of right female breast: Secondary | ICD-10-CM | POA: Diagnosis not present

## 2024-03-07 ENCOUNTER — Inpatient Hospital Stay: Payer: Medicare Other | Attending: Hematology and Oncology | Admitting: Hematology and Oncology

## 2024-03-07 VITALS — BP 130/69 | HR 78 | Temp 98.2°F | Resp 18 | Ht 65.0 in | Wt 144.6 lb

## 2024-03-07 DIAGNOSIS — Z923 Personal history of irradiation: Secondary | ICD-10-CM | POA: Diagnosis not present

## 2024-03-07 DIAGNOSIS — F419 Anxiety disorder, unspecified: Secondary | ICD-10-CM | POA: Diagnosis not present

## 2024-03-07 DIAGNOSIS — Z170 Estrogen receptor positive status [ER+]: Secondary | ICD-10-CM | POA: Diagnosis not present

## 2024-03-07 DIAGNOSIS — C50411 Malignant neoplasm of upper-outer quadrant of right female breast: Secondary | ICD-10-CM | POA: Insufficient documentation

## 2024-03-07 DIAGNOSIS — Z1721 Progesterone receptor positive status: Secondary | ICD-10-CM | POA: Insufficient documentation

## 2024-03-07 DIAGNOSIS — Z79899 Other long term (current) drug therapy: Secondary | ICD-10-CM | POA: Diagnosis not present

## 2024-03-07 DIAGNOSIS — M810 Age-related osteoporosis without current pathological fracture: Secondary | ICD-10-CM | POA: Insufficient documentation

## 2024-03-07 DIAGNOSIS — Z1732 Human epidermal growth factor receptor 2 negative status: Secondary | ICD-10-CM | POA: Diagnosis not present

## 2024-03-07 NOTE — Assessment & Plan Note (Signed)
 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  0/2 lymph nodes negative (based on the biopsy tumor size is 0.3 cm)   Treatment plan: 1.  Adjuvant radiation therapy (based upon her age and favorable surgical results the decision was made to omit radiation) 2. adjuvant antiestrogen therapy started November 2023-discontinued 02/20/2022 due to rash   Anastrozole toxicities: Rash on the chin and neck which led to discontinuation of anastrozole therapy.  Patient does not want to take any further antiestrogen therapy.   Osteoporosis: Bone density showed a T-score of -2.6: Patient will discuss with her PCP about bisphosphonate therapy.   Breast cancer surveillance: Breast exam 03/07/2024: Benign Mammogram: 08/18/2023: Benign breast density category B   Return to clinic in 1 year for follow-up

## 2024-03-07 NOTE — Progress Notes (Signed)
 "  Patient Care Team: Kip Righter, MD as PCP - General (Family Medicine) Tyree Nanetta SAILOR, RN as Oncology Nurse Navigator  DIAGNOSIS:  Encounter Diagnosis  Name Primary?   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  0/2 lymph nodes negative (based on the biopsy tumor size is 0.3 cm)     CHIEF COMPLIANT: Surveillance of breast cancer  HISTORY OF PRESENT ILLNESS: Discussed the use of AI scribe software for clinical note transcription with the patient, who gave verbal consent to proceed.  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  She was diagnosed in July 2023 and had lumpectomy with sentinel lymph node biopsy in August 2023, which showed a 0.3 cm low-grade tumor in the right upper-outer quadrant with negative nodes. She started adjuvant anastrozole in November 2023 but stopped in January 2024 due to a rash and intolerable side effects. She has not resumed endocrine therapy and is on surveillance.  She has no new breast symptoms, masses, or skin changes. She denies fatigue, abnormal bleeding, or other systemic symptoms. She is up to date with annual mammograms and clinical breast exams.  She has osteoporosis treated with vitamin D  and calcium. She is cautious with activity to protect bone health and has no current bone or  joint pain.  She had diverticulitis last summer requiring a short hospitalization and has recovered fully, with a normal colonoscopy since.  She takes medication for anxiety with good effect and remains active caring for her grandchildren without functional limitations, though she has mild anxiety around medical visits.      ALLERGIES:  is allergic to prolia  [denosumab ] and amoxicillin .  MEDICATIONS:  Current Outpatient Medications  Medication Sig Dispense Refill   cetirizine (ZYRTEC) 5 MG tablet Take 5 mg by mouth as needed for allergies.     escitalopram (LEXAPRO) 10 MG tablet Take 10 mg by mouth daily.     polyethylene glycol (MIRALAX  / GLYCOLAX ) 17 g packet Take 17 g by mouth daily.     aspirin (ASPIRIN 81) 81 MG chewable tablet 81 mg.     No current facility-administered medications for this visit.    PHYSICAL EXAMINATION: ECOG PERFORMANCE STATUS: 1 - Symptomatic but completely ambulatory  Vitals:   03/07/24 1107  BP: 130/69  Pulse: 78  Resp: 18  Temp: 98.2 F (36.8 C)  SpO2: 100%   Filed Weights   03/07/24 1107  Weight: 144 lb 9.6 oz (65.6 kg)    Physical Exam MEASUREMENTS: Weight- 144 lbs. BREAST: Breasts normal  (exam performed in the presence of a chaperone)  LABORATORY DATA:  I have reviewed the data as listed    Latest Ref Rng & Units 08/04/2023    5:49 AM 08/03/2023    8:50 AM 08/02/2023    3:49 AM  CMP  Glucose 70 - 99 mg/dL 895  878  848   BUN 8 - 23 mg/dL <5  8  8   Creatinine 0.44 - 1.00 mg/dL 9.44  9.40  9.31   Sodium 135 - 145 mmol/L 140  137  136   Potassium 3.5 - 5.1 mmol/L 3.4  3.7  3.8   Chloride 98 - 111 mmol/L 108  103  103   CO2 22 - 32 mmol/L 23  23  24    Calcium 8.9 - 10.3 mg/dL 8.3  8.6  8.4   Total Protein 6.5 - 8.1 g/dL   6.4   Total Bilirubin 0.0 - 1.2 mg/dL   0.3   Alkaline Phos 38 - 126 U/L   59   AST 15 - 41 U/L   17   ALT 0 - 44 U/L   12     Lab Results  Component Value Date   WBC 5.3 08/04/2023   HGB 11.3 (L)  08/04/2023   HCT 34.1 (L) 08/04/2023   MCV 85.9 08/04/2023   PLT 213 08/04/2023   NEUTROABS 3.2 08/27/2021    ASSESSMENT & PLAN:  Malignant neoplasm of upper-outer quadrant of right breast in female, estrogen receptor positive (HCC) 08/21/2021:Screening mammogram detected right breast calcifications 6 mm group UOQ stereotactic biopsy revealed 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  0/2 lymph nodes negative (based on the biopsy tumor size is 0.3 cm)   Treatment plan: 1.  Adjuvant radiation therapy (based upon her age and favorable surgical results the decision was made to omit radiation) 2. adjuvant antiestrogen therapy started November 2023-discontinued 02/20/2022 due to rash   Anastrozole toxicities: Rash on the chin and neck which led to discontinuation of anastrozole therapy.  Patient does not want to take any further antiestrogen therapy.   Osteoporosis: Bone density showed a T-score of -2.6: Patient will discuss with her PCP about bisphosphonate therapy.   Breast cancer surveillance: Breast exam 03/07/2024: Benign Mammogram: 08/18/2023: Benign breast density category B   Return to clinic in 1 year for follow-up  Assessment & Plan Malignant neoplasm of upper-outer quadrant of right breast, estrogen receptor positive Two and a half years post-diagnosis, status post right lumpectomy with sentinel lymph node biopsy. Discontinued anastrozole. Remains in remission, asymptomatic, low recurrence risk, no evidence of disease. Ongoing survivorship care needed, focus on late recurrence and bone health. - Performed clinical breast exam (benign). - Continue annual mammography and clinical breast exams; next mammogram scheduled for August 18, 2023. - Oncology follow-up in one year, earlier if symptoms develop. - Advised to contact clinic sooner if needed. - Encouraged regular physical  activity and healthy lifestyle. - Coordinate with primary care for osteoporosis management and consider bisphosphonate therapy.      No orders of the defined types were placed in this encounter.  The patient has a good understanding of the overall plan. she agrees with it. she will call with any problems that may develop before the next visit here.  I personally spent a total of 30 minutes in the care of the patient today including preparing to see the patient, getting/reviewing separately obtained history, performing a medically appropriate exam/evaluation, counseling and educating, placing orders, referring and communicating with other health care professionals, documenting clinical information in the EHR, independently interpreting results, communicating results, and coordinating care.   Viinay K Sausha Raymond, MD 03/07/24    "

## 2024-04-10 ENCOUNTER — Ambulatory Visit: Attending: Hematology and Oncology

## 2025-03-08 ENCOUNTER — Inpatient Hospital Stay: Admitting: Hematology and Oncology
